# Patient Record
Sex: Female | Born: 1980 | Smoking: Former smoker
Health system: Southern US, Community
[De-identification: ages and names within clinical notes are randomized; demographics above are authoritative.]

## PROBLEM LIST (undated history)

## (undated) DIAGNOSIS — R519 Headache, unspecified: Secondary | ICD-10-CM

## (undated) DIAGNOSIS — N200 Calculus of kidney: Secondary | ICD-10-CM

## (undated) DIAGNOSIS — R569 Unspecified convulsions: Secondary | ICD-10-CM

## (undated) DIAGNOSIS — F419 Anxiety disorder, unspecified: Secondary | ICD-10-CM

## (undated) DIAGNOSIS — R51 Headache: Secondary | ICD-10-CM

## (undated) DIAGNOSIS — J45909 Unspecified asthma, uncomplicated: Secondary | ICD-10-CM

## (undated) DIAGNOSIS — M549 Dorsalgia, unspecified: Secondary | ICD-10-CM

## (undated) HISTORY — DX: Headache, unspecified: R51.9

## (undated) HISTORY — PX: WISDOM TOOTH EXTRACTION: SHX21

## (undated) HISTORY — DX: Unspecified asthma, uncomplicated: J45.909

## (undated) HISTORY — DX: Dorsalgia, unspecified: M54.9

## (undated) HISTORY — DX: Anxiety disorder, unspecified: F41.9

## (undated) HISTORY — DX: Calculus of kidney: N20.0

## (undated) HISTORY — DX: Headache: R51

## (undated) HISTORY — DX: Unspecified convulsions: R56.9

## (undated) HISTORY — DX: Morbid (severe) obesity due to excess calories: E66.01

---

## 2016-05-17 ENCOUNTER — Other Ambulatory Visit (HOSPITAL_COMMUNITY): Payer: Self-pay | Admitting: General Surgery

## 2016-06-05 ENCOUNTER — Encounter: Payer: BLUE CROSS/BLUE SHIELD | Attending: Family Medicine | Admitting: Registered"

## 2016-06-05 ENCOUNTER — Encounter: Payer: Self-pay | Admitting: Registered"

## 2016-06-05 DIAGNOSIS — Z87891 Personal history of nicotine dependence: Secondary | ICD-10-CM | POA: Insufficient documentation

## 2016-06-05 DIAGNOSIS — Z713 Dietary counseling and surveillance: Secondary | ICD-10-CM | POA: Diagnosis present

## 2016-06-05 DIAGNOSIS — E669 Obesity, unspecified: Secondary | ICD-10-CM

## 2016-06-05 NOTE — Progress Notes (Addendum)
Pre-Op Assessment Visit:  Pre-Operative Sleeve Gastrectomy Surgery  Medical Nutrition Therapy:  Appt start time: 2:04  End time:  3:05  Patient was seen on 06/05/2016 for Pre-Operative Nutrition Assessment. Assessment and [facility] on 06/05/2016.   Pt expectation of surgery: reduce health risks  Pt expectation of Dietitian: Something that fits mutiple eating habits  Start weight at N[facility]: 268.0 BMI: 47.47   Pt arrives with husband. Pt states she quit smoking and  changed jobs which led to weight gain. Pt states she likes to bake and sweets are her downfall. Pt states she and her husband are going to make the necessary changes together. Pt states she is really busy at work and doesn't have much time to eat and/or drink.   Pt needs 6 SWL visits prior to surgery, pre referral form.    24 hr Dietary Recall: First Meal: skips Snack: peanut butter crackers Second Meal: skips; cracker Snack: none Third Meal: pizza rolls, frozen pizza, hot dogs, apple pie with ice cream, 2 layer cake  Snack: apple pie with ice cream Beverages: Gretel light with water, coffee, skim/2% milk, V8 juice, fruit juices  Encouraged to engage in 150 minutes of moderate physical activity including cardiovascular and weight baring weekly  Handouts given during visit include:  . Pre-Op Goals . Bariatric Surgery Protein Shakes . Vitamin and Mineral Recommendations  During the appointment today the following Pre-Op Goals were reviewed with the patient: . Maintain or lose weight as instructed by your surgeon . Make healthy food choices . Begin to limit portion sizes . Limited concentrated sugars and fried foods . Keep fat/sugar in the single digits per serving on          food labels . Practice CHEWING your food  (aim for 30 chews per bite or until applesauce consistency) . Practice not drinking 15 minutes before, during, and 30  minutes after each meal/snack . Avoid all carbonated beverages  . Avoid/limit caffeinated beverages  . Avoid all sugar-sweetened beverages . Consume 3 meals per day; eat every 3-5 hours . Make a list of non-food related activities . Aim for 64-100 ounces of FLUID daily  . Aim for at least 60-80 grams of PROTEIN daily . Look for a liquid protein source that contain ?15 g protein and ?5 g carbohydrate  (ex: shakes, drinks, shots) . Physical activity is an important part of a healthy lifestyle so keep it moving!  Follow diet recommendations listed below Energy and Macronutrient Recommendations: Calories: 1800 Carbohydrate: 200 Protein: 135 Fat: 50  Demonstrated degree of understanding via:  Teach Back   Teaching Method Utilized:  Visual Auditory Hands on  Barriers to learning/adherence to lifestyle change: work-life balance  Patient to call the Nutrition and Diabetes Education Services to enroll in Pre-Op and Post-Op Nutrition Education when surgery date is scheduled.

## 2016-06-11 ENCOUNTER — Other Ambulatory Visit: Payer: Self-pay

## 2016-06-11 ENCOUNTER — Ambulatory Visit (HOSPITAL_COMMUNITY)
Admission: RE | Admit: 2016-06-11 | Discharge: 2016-06-11 | Disposition: A | Discharge status: Home / Self Care | Payer: BLUE CROSS/BLUE SHIELD | Source: Ambulatory Visit | Attending: General Surgery | Admitting: General Surgery

## 2016-06-11 DIAGNOSIS — Z888 Allergy status to other drugs, medicaments and biological substances status: Secondary | ICD-10-CM | POA: Diagnosis not present

## 2016-06-11 DIAGNOSIS — Z9889 Other specified postprocedural states: Secondary | ICD-10-CM | POA: Diagnosis not present

## 2016-06-11 DIAGNOSIS — Z8249 Family history of ischemic heart disease and other diseases of the circulatory system: Secondary | ICD-10-CM | POA: Diagnosis not present

## 2016-06-11 DIAGNOSIS — K449 Diaphragmatic hernia without obstruction or gangrene: Secondary | ICD-10-CM | POA: Insufficient documentation

## 2016-06-11 DIAGNOSIS — Z811 Family history of alcohol abuse and dependence: Secondary | ICD-10-CM | POA: Diagnosis not present

## 2016-06-11 DIAGNOSIS — J45909 Unspecified asthma, uncomplicated: Secondary | ICD-10-CM | POA: Diagnosis not present

## 2016-06-11 DIAGNOSIS — Z8261 Family history of arthritis: Secondary | ICD-10-CM | POA: Insufficient documentation

## 2016-06-11 DIAGNOSIS — Z818 Family history of other mental and behavioral disorders: Secondary | ICD-10-CM | POA: Diagnosis not present

## 2016-06-11 DIAGNOSIS — K30 Functional dyspepsia: Secondary | ICD-10-CM | POA: Diagnosis not present

## 2016-06-11 DIAGNOSIS — Z825 Family history of asthma and other chronic lower respiratory diseases: Secondary | ICD-10-CM | POA: Diagnosis not present

## 2016-06-11 DIAGNOSIS — Z01810 Encounter for preprocedural cardiovascular examination: Secondary | ICD-10-CM | POA: Diagnosis present

## 2016-06-11 DIAGNOSIS — F419 Anxiety disorder, unspecified: Secondary | ICD-10-CM | POA: Diagnosis not present

## 2016-06-27 ENCOUNTER — Ambulatory Visit (INDEPENDENT_AMBULATORY_CARE_PROVIDER_SITE_OTHER): Payer: BLUE CROSS/BLUE SHIELD | Admitting: Neurology

## 2016-06-27 ENCOUNTER — Encounter: Payer: Self-pay | Admitting: Neurology

## 2016-06-27 VITALS — BP 144/84 | HR 57 | Ht 63.0 in | Wt 266.0 lb

## 2016-06-27 DIAGNOSIS — Z6841 Body Mass Index (BMI) 40.0 and over, adult: Secondary | ICD-10-CM

## 2016-06-27 DIAGNOSIS — R351 Nocturia: Secondary | ICD-10-CM | POA: Diagnosis not present

## 2016-06-27 DIAGNOSIS — R0683 Snoring: Secondary | ICD-10-CM | POA: Diagnosis not present

## 2016-06-27 DIAGNOSIS — Z820 Family history of epilepsy and other diseases of the nervous system: Secondary | ICD-10-CM

## 2016-06-27 DIAGNOSIS — R51 Headache: Secondary | ICD-10-CM | POA: Diagnosis not present

## 2016-06-27 DIAGNOSIS — R519 Headache, unspecified: Secondary | ICD-10-CM

## 2016-06-27 DIAGNOSIS — G2581 Restless legs syndrome: Secondary | ICD-10-CM | POA: Diagnosis not present

## 2016-06-27 NOTE — Progress Notes (Signed)
Subjective:    Patient ID: Donna Mcintyre is a 36 y.o. female.  HPI     Donna FoleySaima Elman Dettman, MD, PhD [facility] [address], [address] [address] [place], [place] [id number]   Dear Dr. Andrey CampanileWilson,  I saw your patient, Donna Mcintyre, upon your kind request in my neurologic clinic today for initial consultation of her sleep disorder, in particular, concern for underlying obstructive sleep apnea. The patient is unaccompanied today. As you know, Ms. Donna Mcintyre is a 36 year old right-handed woman with an underlying medical history of kidney stone, asthma, anxiety, depression, back pain and morbid obesity with a BMI of over 40, who had a home sleep test in April 2018, which showed an AHI of 3 per hour, and further evaluation was recommended based on clinical history and high pretest probability of having OSA. I reviewed her home sleep test report from 04/17/2016 (report date 04/25/16). I also reviewed your office note from 05/16/2016. She is being considered for sleeve gastrectomy. She quit smoking in November 2017. She smoked for about 25 years. She has rare alcohol use, a remote history of kidney stones. Her Epworth Sleepiness Scale score is 5 out of 24, fatigue score is 37 out of 63. She lives at home with her husband and 3 children. She works at [employer]. She drinks alcohol only twice a year or so, she drinks caffeine, in the form of coffee, usually one cup per day.  She is not a good sleeper, she admits and as long a she can remember, she has had difficulty falling asleep and staying asleep for years.  Her father has OSA. Her bedtime is typically around 10 AM but she typically may not be asleep until 1 AM. Wake up time is around 6:30 AM. She has occasional restless leg symptoms but is not sure if she moves her legs in her sleep. She has had occasional morning headaches, she has nocturia usually once per average night. She has 3 kids, ages 5018, 4316 and 468. Her husband also snores but  she does not seem to be disturbed by it. She has 1 older brother who does not have any known history of sleep apnea. She works from 7 AM to 7 PM. She works 5 days a week. She has an appointment pending with psychology next month.   Her Past Medical History Is Significant For: Past Medical History:  Diagnosis Date  . Anxiety   . Asthma   . Back pain   . Headache   . Kidney stone   . Morbid obesity (HCC)   . Seizures (HCC)     Her Past Surgical History Is Significant For: Past Surgical History:  Procedure Laterality Date  . CESAREAN SECTION    . WISDOM TOOTH EXTRACTION      Her Family History Is Significant For: Family History  Problem Relation Age of Onset  . Asthma Other   . Cancer Other   . Hypertension Other   . Heart disease Other   . Diabetes Other   . Arthritis Mother   . Alcohol abuse Father   . Arthritis Father   . Hypertension Father   . Heart failure Father   . Alcohol abuse Brother   . Heart disease Paternal Grandmother   . Cancer Paternal Grandfather     Her Social History Is Significant For: Social History   Social History  . Marital status: Unknown    Spouse name: N/A  . Number of children: N/A  . Years of  education: N/A   Social History Main Topics  . Smoking status: Former Games developer  . Smokeless tobacco: Never Used  . Alcohol use None  . Drug use: No  . Sexual activity: Not Asked   Other Topics Concern  . None   Social History Narrative  . None    Her Allergies Are:  Allergies  Allergen Reactions  . Levaquin [Levofloxacin In D5w] Other (See Comments)    Throat swells  :   Her Current Medications Are:  No outpatient encounter prescriptions on file as of 06/27/2016.   No facility-administered encounter medications on file as of 06/27/2016.   :  Review of Systems:  Out of a complete 14 point review of systems, all are reviewed and negative with the exception of these symptoms as listed below: Review of Systems  Neurological:        Pt presents today to discuss abnormal HST results. She had an HST in April. Pt does endorse snoring.  Epworth Sleepiness Scale 0= would never doze 1= slight chance of dozing 2= moderate chance of dozing 3= high chance of dozing  Sitting and reading: 0 Watching TV: 0 Sitting inactive in a public place (ex. Theater or meeting): 1 As a passenger in a car for an hour without a break: 0 Lying down to rest in the afternoon: 2 Sitting and talking to someone: 1 Sitting quietly after lunch (no alcohol): 1 In a car, while stopped in traffic: 0 Total: 5     Objective:  Neurologic Exam  Physical Exam Physical Examination:   Vitals:   06/27/16 1054  BP: (!) 144/84  Pulse: (!) 57   General Examination: The patient is a very pleasant 36 y.o. female in no acute distress. She appears well-developed and well-nourished and well groomed.   HEENT: Normocephalic, atraumatic, pupils are equal, round and reactive to light and accommodation. Extraocular tracking is good without limitation to gaze excursion or nystagmus noted. Normal smooth pursuit is noted. Hearing is grossly intact. Face is symmetric with normal facial animation and normal facial sensation. Speech is clear with no dysarthria noted. There is no hypophonia. There is no lip, neck/head, jaw or voice tremor. Neck is supple with full range of passive and active motion. There are no carotid bruits on auscultation. Oropharynx exam reveals: mild mouth dryness, adequate dental hygiene and moderate airway crowding, due to tonsillar size of 2+ to 3+ bilaterally, smaller airway entry, thicker appearing uvula, Mallampati is class I. Tongue protrudes centrally and palate elevates symmetrically. 1+/2+ in size/absent. Neck size is 17 1/8 inches. She has a Mild overbite.  Chest: Clear to auscultation without wheezing, rhonchi or crackles noted.  Heart: S1+S2+0, regular and normal without murmurs, rubs or gallops noted.   Abdomen: Soft, non-tender and  non-distended with normal bowel sounds appreciated on auscultation.  Extremities: There is no pitting edema in the distal lower extremities bilaterally. Pedal pulses are intact.  Skin: Warm and dry without trophic changes noted. There are no varicose veins.  Musculoskeletal: exam reveals no obvious joint deformities, tenderness or joint swelling or erythema.   Neurologically:  Mental status: The patient is awake, alert and oriented in all 4 spheres. Her immediate and remote memory, attention, language skills and fund of knowledge are appropriate. There is no evidence of aphasia, agnosia, apraxia or anomia. Speech is clear with normal prosody and enunciation. Thought process is linear. Mood is normal and affect is normal.  Cranial nerves II - XII are as described above under HEENT exam.  In addition: shoulder shrug is normal with equal shoulder height noted. Motor exam: Normal bulk, strength and tone is noted. There is no drift, tremor or rebound. Romberg is negative. Reflexes are 2+ throughout. Babinski: Toes are flexor bilaterally. Fine motor skills and coordination: intact with normal finger taps, normal hand movements, normal rapid alternating patting, normal foot taps and normal foot agility.  Cerebellar testing: No dysmetria or intention tremor on finger to nose testing. Heel to shin is unremarkable bilaterally. There is no truncal or gait ataxia.  Sensory exam: intact to light touch in the upper and lower extremities.  Gait, station and balance: She stands easily. No veering to one side is noted. No leaning to one side is noted. Posture is age-appropriate and stance is narrow based. Gait shows normal stride length and normal pace. No problems turning are noted. Tandem walk is unremarkable.   Assessment and Plan:  In summary, Mazzy Marzano is a very pleasant 36 y.o.-year old female with a history and physical exam concerning for obstructive sleep apnea (OSA). I had a long chat with the  patient about my findings and the diagnosis of OSA, its prognosis and treatment options. We talked about medical treatments, surgical interventions and non-pharmacological approaches. I explained in particular the risks and ramifications of untreated moderate to severe OSA, especially with respect to developing cardiovascular disease down the Road, including congestive heart failure, difficult to treat hypertension, cardiac arrhythmias, or stroke. Even type 2 diabetes has, in part, been linked to untreated OSA. Symptoms of untreated OSA include daytime sleepiness, memory problems, mood irritability and mood disorder such as depression and anxiety, lack of energy, as well as recurrent headaches, especially morning headaches. We talked about trying to maintain a healthy lifestyle in general, as well as the importance of weight control. I encouraged the patient to eat healthy, exercise daily and keep well hydrated, to keep a scheduled bedtime and wake time routine, to not skip any meals and eat healthy snacks in between meals. I advised the patient not to drive when feeling sleepy. I recommended the following at this time: sleep study with potential positive airway pressure titration. (We will score hypopneas at 3%). Of note, the patient had a negative home sleep test in April 2018, therefore, and attended sleep study is indicated in the context of high clinical suspicion for OSA (snoring, morning headaches, difficulty with sleep initiation and sleep maintenance, morbid obesity), especially as the patient is planning elective surgery.  I explained the sleep test procedure to the patient and also outlined possible surgical and non-surgical treatment options of OSA, including the use of a custom-made dental device (which would require a referral to a specialist dentist or oral surgeon), upper airway surgical options, such as pillar implants, radiofrequency surgery, tongue base surgery, and UPPP (which would involve a  referral to an ENT surgeon). Rarely, jaw surgery such as mandibular advancement may be considered.  I also explained the CPAP treatment option to the patient, who indicated that she would be willing to try CPAP if the need arises. I explained the importance of being compliant with PAP treatment, not only for insurance purposes but primarily to improve Her symptoms, and for the patient's long term health benefit, including to reduce Her cardiovascular risks. I answered all her questions today and the patient was in agreement. I would like to see her back after the sleep study is completed and encouraged her to call with any interim questions, concerns, problems or updates.   Thank you  very much for allowing me to participate in the care of this nice patient. If I can be of any further assistance to you please do not hesitate to call me at [phone].  Sincerely,   Star Age, MD, PhD

## 2016-06-27 NOTE — Patient Instructions (Signed)

## 2016-07-08 ENCOUNTER — Encounter: Payer: Self-pay | Admitting: Registered"

## 2016-07-08 ENCOUNTER — Encounter: Payer: BLUE CROSS/BLUE SHIELD | Attending: Family Medicine | Admitting: Registered"

## 2016-07-08 DIAGNOSIS — Z713 Dietary counseling and surveillance: Secondary | ICD-10-CM | POA: Insufficient documentation

## 2016-07-08 DIAGNOSIS — E669 Obesity, unspecified: Secondary | ICD-10-CM

## 2016-07-08 DIAGNOSIS — Z87891 Personal history of nicotine dependence: Secondary | ICD-10-CM | POA: Insufficient documentation

## 2016-07-08 NOTE — Patient Instructions (Addendum)
-   Aim to get 64 oz of fluid a day.   - Take a multivitamin complete (with iron) and Vitamin D supplement.   - Keep up the great work with what you're doing!

## 2016-07-08 NOTE — Progress Notes (Signed)
Appt start time: 10:20 end time: 10:35  Assessment: 1st SWL Appointment.   Start Wt at NDES: 268.0 Wt: 268.1 BMI: 47.49    Pt arrives having maintained weight from previous visit. Pt states she is working on chewing. Pt reports it is easier to swallow meat and not potatoes. Pt states its a challenge not drinking with meals but still working on it. Pt reports working on getting 3 meals most days of the week and has increased water intake. Pt states she is eating smaller portions and has eliminated desserts.    MEDICATIONS: See list   DIETARY INTAKE:  24-hr recall:  B ( AM): cereal (Shredded wheat)  Snk ( AM): none  L ( PM): sometime skips; Keye to Healthy (baked chicken breast, salsa, black beans, pureed cauliflower) Snk ( PM): none D ( PM): grilled burgers or baked chicken, beans, mac and cheese, potatoes Snk ( PM): none Beverages: water, sugar-free popsicles, Misa light, 2% milk    Usual physical activity: mowing grass  Diet to Follow: Calories: 1800 kcals Carbohydrate: 200g Protein: 135g Fat: 50g  Preferred Learning Style:   No preference indicated   Learning Readiness:   Ready  Change in progress     Nutritional Diagnosis:  [place]-3.3 Overweight/obesity related to past poor dietary habits and physical inactivity as evidenced by patient w/ planned sleeve gastrectomy surgery following dietary guidelines for continued weight loss.    Intervention:  Nutrition counseling for upcoming Bariatric Surgery.  Goals:  - Aim to get 64 oz of fluid a day.  - Take a multivitamin complete (with iron) and Vitamin D supplement.  - Keep up the great work with what you're doing!  Teaching Method Utilized:  Visual Auditory  Handouts given during visit include:  none  Barriers to learning/adherence to lifestyle change: none  Demonstrated degree of understanding via:  Teach Back   Monitoring/Evaluation:  Dietary intake, exercise, and body weight in 1 month(s).

## 2016-07-16 ENCOUNTER — Ambulatory Visit: Payer: BLUE CROSS/BLUE SHIELD | Admitting: Psychiatry

## 2016-07-22 ENCOUNTER — Ambulatory Visit (INDEPENDENT_AMBULATORY_CARE_PROVIDER_SITE_OTHER): Payer: BLUE CROSS/BLUE SHIELD | Admitting: Neurology

## 2016-07-22 DIAGNOSIS — G4733 Obstructive sleep apnea (adult) (pediatric): Secondary | ICD-10-CM | POA: Diagnosis not present

## 2016-07-26 NOTE — Procedures (Signed)
PATIENT'S NAME:  Donna Mcintyre, Donna Mcintyre DOB:      [birth year]      MR#:    [id number]     DATE OF RECORDING: 07/22/2016 REFERRING M.D.:  Gaynelle Adu, MD Study Performed:   Baseline Polysomnogram HISTORY: 36 year old woman with a history of kidney stone, asthma, anxiety, depression, back pain and morbid obesity, who had a home sleep test in April 2018, which showed an AHI of 3 per hour, and further evaluation was recommended based on clinical history and high pretest probability of having OSA. The patient's weight 266 pounds with a height of 63 (inches), resulting in a BMI of 47.3 kg/m2. The patient's neck circumference measured 17.1 inches.   CURRENT MEDICATIONS: Levaquin   PROCEDURE:  This is a multichannel digital polysomnogram utilizing the Somnostar 11.2 system.  Electrodes and sensors were applied and monitored per AASM Specifications.   EEG, EOG, Chin and Limb EMG, were sampled at 200 Hz.  ECG, Snore and Nasal Pressure, Thermal Airflow, Respiratory Effort, CPAP Flow and Pressure, Oximetry was sampled at 50 Hz. Digital video and audio were recorded.      BASELINE STUDY  Lights Out was at 23:05 and Lights On at 05:07.  Total recording time (TRT) was 362.5 minutes, with a total sleep time (TST) of 261 minutes.   The patient's sleep latency was 29.5 minutes, which is delayed.  REM latency was 75 minutes, which is normal. The sleep efficiency was 72. %.     SLEEP ARCHITECTURE: WASO (Wake after sleep onset) was increased and patient did not go back to sleep after 03:53 AM, after her restroom break. There were 9.5 minutes in Stage N1, 144.5 minutes Stage N2, 65 minutes Stage N3 and 42 minutes in Stage REM.  The percentage of Stage N1 was 3.6%, Stage N2 was 55.4%, which is normal, Stage N3 was 24.9%, and Stage R (REM sleep) was 16.1%, which is mildly reduced.  The arousals were noted as: 23 were spontaneous, 0 were associated with PLMs, 42 were associated with respiratory events.    Audio and video analysis  did not show any abnormal or unusual movements, behaviors, phonations or vocalizations.  The patient took 1 bathroom break. Snoring was noted, mostly in the moderate range. The EKG was in keeping with normal sinus rhythm (NSR).  RESPIRATORY ANALYSIS:  There were a total of 42 respiratory events:  20 obstructive apneas, 0 central apneas and 0 mixed apneas with a total of 20 apneas and an apnea index (AI) of 4.6 /hour. There were 22 hypopneas with a hypopnea index of 5.1 /hour. The patient also had 0 respiratory event related arousals (RERAs).      The total APNEA/HYPOPNEA INDEX (AHI) was 9.7/hour and the total RESPIRATORY DISTURBANCE INDEX was 9.7 /hour.  35 events occurred in REM sleep and 14 events in NREM. The REM AHI was 50 /hour, versus a non-REM AHI of 1.9. The patient spent 261 minutes of total sleep time in the supine position and 0 minutes in non-supine.. The supine AHI was 9.7 versus a non-supine AHI of 0.0.  OXYGEN SATURATION & C02:  The Wake baseline 02 saturation was 97%, with the lowest being 81%. Time spent below 89% saturation equaled 4 minutes.  PERIODIC LIMB MOVEMENTS: The patient had a total of 0 Periodic Limb Movements. The Periodic Limb Movement (PLM) index was 0 and the PLM Arousal index was 0/hour.  Post-study, the patient indicated that sleep was the same as usual.   IMPRESSION:  1. Obstructive Sleep Apnea (  OSA)  RECOMMENDATIONS:  1. This study demonstrates overall mild obstructive sleep apnea, severe in REM sleep, with a total AHI of 9.7/hour, REM AHI of 50/hour, and O2 nadir of 81% (during supine REM sleep). Given the patient's medical history and weight loss surgery planned, a full-night CPAP titration study is recommended to optimize therapy. Other treatment options may include avoidance of supine sleep position along with weight loss, upper airway or jaw surgery in selected patients or the use of an oral appliance in certain patients. ENT evaluation and/or consultation  with a maxillofacial surgeon or dentist may be feasible in some instances.    2. Please note that untreated obstructive sleep apnea carries additional perioperative morbidity. Patients with significant obstructive sleep apnea should receive perioperative PAP therapy and the surgeons and particularly the anesthesiologist should be informed of the diagnosis and the severity of the sleep disordered breathing. 3. The patient should be cautioned not to drive, work at heights, or operate dangerous or heavy equipment when tired or sleepy. Review and reiteration of good sleep hygiene measures should be pursued with any patient. 4. The patient will be [facility] discussion of the test results and further management strategies. The referring provider will be notified of the test results.  I certify that I have reviewed the entire raw data recording prior to the issuance of this report in accordance with the Standards of Accreditation of the American Academy of Sleep Medicine (AASM)   Huston FoleySaima Jovanny Stephanie, MD, PhD Diplomat, American Board of Psychiatry and Neurology (Neurology and Sleep Medicine)

## 2016-07-26 NOTE — Progress Notes (Signed)
Patient referred by Dr. Andrey CampanileWilson in surgery, seen by me on 06/27/16, after a neg HST, diagnostic PSG on 07/22/16 d/t high risk for OSA.    Please call and notify the patient that the recent sleep study did confirm the diagnosis obstructive sleep apnea and that I recommend treatment for this in the form of CPAP. She has overall mild obstructive sleep apnea, severe in REM sleep, with a total AHI of 9.7/hour, REM AHI of 50/hour, and O2 nadir of 81% (during supine REM sleep). Given the patient's medical history and weight loss surgery planned, a full-night CPAP titration study is recommended to optimize therapy. This will require a repeat sleep study for proper titration and mask fitting. Please explain to patient and arrange for a CPAP titration study. I have placed an order in the chart. Thanks, and [facility].  Huston FoleySaima Bruin Bolger, MD, PhD [facility]

## 2016-07-26 NOTE — Addendum Note (Signed)
Addended by: Huston FoleyATHAR, Adonai Helzer on: 07/26/2016 12:46 PM   Modules accepted: Orders

## 2016-07-29 ENCOUNTER — Telehealth: Payer: Self-pay

## 2016-07-29 NOTE — Telephone Encounter (Signed)
I called pt to discuss her sleep study results. No answer, left a message asking her to call me back. 

## 2016-07-29 NOTE — Telephone Encounter (Signed)
-----   Message from Huston FoleySaima Athar, MD sent at 07/26/2016 12:46 PM EDT ----- Patient referred by Dr. Andrey CampanileWilson in surgery, seen by me on 06/27/16, after a neg HST, diagnostic PSG on 07/22/16 d/t high risk for OSA.    Please call and notify the patient that the recent sleep study did confirm the diagnosis obstructive sleep apnea and that I recommend treatment for this in the form of CPAP. She has overall mild obstructive sleep apnea, severe in REM sleep, with a total AHI of 9.7/hour, REM AHI of 50/hour, and O2 nadir of 81% (during supine REM sleep). Given the patient's medical history and weight loss surgery planned, a full-night CPAP titration study is recommended to optimize therapy. This will require a repeat sleep study for proper titration and mask fitting. Please explain to patient and arrange for a CPAP titration study. I have placed an order in the chart. Thanks, and [facility].  Huston FoleySaima Athar, MD, PhD [facility])

## 2016-07-29 NOTE — Telephone Encounter (Signed)
I called pt. I advised pt that Dr. Frances FurbishAthar reviewed their sleep study results and found that pt has osa, mild overall, but severe osa in her REM sleep and Dr. Frances FurbishAthar recommends a cpap for treatment for osa. Dr. Frances FurbishAthar recommends that pt return for a repeat sleep study in order to properly titrate the cpap and ensure a good mask fit. Pt is agreeable to returning for a titration study. I advised pt that our sleep lab will file with pt's insurance and call pt to schedule the sleep study when we hear back from the pt's insurance regarding coverage of this sleep study. Pt verbalized understanding of results. Pt had no questions at this time but was encouraged to call back if questions arise.

## 2016-08-07 ENCOUNTER — Encounter: Payer: BLUE CROSS/BLUE SHIELD | Attending: Family Medicine | Admitting: Registered"

## 2016-08-07 ENCOUNTER — Encounter: Payer: Self-pay | Admitting: Registered"

## 2016-08-07 DIAGNOSIS — Z87891 Personal history of nicotine dependence: Secondary | ICD-10-CM | POA: Insufficient documentation

## 2016-08-07 DIAGNOSIS — Z713 Dietary counseling and surveillance: Secondary | ICD-10-CM | POA: Insufficient documentation

## 2016-08-07 DIAGNOSIS — E669 Obesity, unspecified: Secondary | ICD-10-CM
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
## 2016-08-07 NOTE — Patient Instructions (Addendum)
-   Increase physical activity to walking 2 blocks around neighborhood for 20 min/day, 7 days/week.  - Continue to aim to slow down and take smaller bites the size of a dime or thumbnail.   - Continue to aim for 64 oz of fluid daily.

## 2016-08-12 ENCOUNTER — Ambulatory Visit (INDEPENDENT_AMBULATORY_CARE_PROVIDER_SITE_OTHER): Payer: BLUE CROSS/BLUE SHIELD | Admitting: Neurology

## 2016-08-12 DIAGNOSIS — G4733 Obstructive sleep apnea (adult) (pediatric): Secondary | ICD-10-CM | POA: Diagnosis not present

## 2016-08-14 ENCOUNTER — Telehealth: Payer: Self-pay

## 2016-08-14 NOTE — Procedures (Signed)
PATIENT'S NAME:  Donna Mcintyre, Donna Mcintyre DOB:      03/22/1980      MR#:    [id number]     DATE OF RECORDING: 08/12/2016 REFERRING M.D.:  Gaynelle AduEric Wilson, MD Study Performed:   CPAP  Titration HISTORY: 36 year old woman with a history of kidney stone, asthma, anxiety, depression, back pain and morbid obesity, who had a home sleep test in April 2018, which showed an AHI of 3 per hour, and further evaluation was recommended based on clinical history and high pretest probability of having OSA. Her lab attended PSG on 07/22/16 showed a total AHI of 9.7/hour, REM AHI of 50/hour, and O2 nadir of 81% (during supine REM sleep). She returns for a CPAP titration. BMI of 47.3 kg/m2, neck circumference measured 17 inches.  CURRENT MEDICATIONS: None  PROCEDURE:  This is a multichannel digital polysomnogram utilizing the SomnoStar 11.2 system.  Electrodes and sensors were applied and monitored per AASM Specifications.   EEG, EOG, Chin and Limb EMG, were sampled at 200 Hz.  ECG, Snore and Nasal Pressure, Thermal Airflow, Respiratory Effort, CPAP Flow and Pressure, Oximetry was sampled at 50 Hz. Digital video and audio were recorded.      The patient was fitted with a small P10 nasal pillows interface. CPAP was initiated at 5 cmH20 with heated humidity per AASM standards and pressure was advanced to 9 cmH20 because of hypopneas, apneas and desaturations.  At a PAP pressure of 8 cmH20, there was a reduction of the AHI to 0 with supine REM sleep achieved and O2 nadir of 96%.    Lights Out was at 21:11 and Lights On at 05:01. Total recording time (TRT) was 470 minutes, with a total sleep time (TST) of 350 minutes. The patient's sleep latency was 129 minutes, which is markedly delayed, REM latency was 93 minutes, which is normal. The sleep efficiency was 74.5 %.    SLEEP ARCHITECTURE: WASO (Wake after sleep onset) was 15.5 minutes without significant sleep fragmentation noted. There were 16 minutes in Stage N1, 165.5 minutes Stage  N2, 78.5 minutes Stage N3 and 90 minutes in Stage REM.  The percentage of Stage N1 was 4.6%, Stage N2 was 47.3%, which is normal, Stage N3 was 22.4%, which is normal, and Stage R (REM sleep) was 25.7%, which is normal. The arousals were noted as: 16 were spontaneous, 0 were associated with PLMs, 1 were associated with respiratory events.  Audio and video analysis did not show any abnormal or unusual movements, behaviors, phonations or vocalizations. The patient took no bathroom breaks. The EKG was in keeping with normal sinus rhythm (NSR).  RESPIRATORY ANALYSIS:  There was a total of 1 respiratory events: 0 obstructive apneas, 0 central apneas and 0 mixed apneas with a total of 0 apneas and an apnea index (AI) of 0 /hour. There were 1 hypopneas with a hypopnea index of .2/hour. The patient also had 0 respiratory event related arousals (RERAs).      The total APNEA/HYPOPNEA INDEX  (AHI) was .2 /hour and the total RESPIRATORY DISTURBANCE INDEX was .2 .hour  0 events occurred in REM sleep and 1 events in NREM. The REM AHI was 0 /hour versus a non-REM AHI of .2 /hour.  The patient spent 337.5 minutes of total sleep time in the supine position and 13 minutes in non-supine. The supine AHI was 0.2, versus a non-supine AHI of 0.0.  OXYGEN SATURATION & C02:  The baseline 02 saturation was 98%, with the lowest being 90%. Time spent  below 89% saturation equaled 0 minutes.  PERIODIC LIMB MOVEMENTS:  The patient had a total of 0 Periodic Limb Movements. The Periodic Limb Movement (PLM) index was 0 and the PLM Arousal index was 0 /hour.  Post-study, the patient indicated that sleep was better than usual.   DIAGNOSIS  1. Obstructive Sleep Apnea (OSA)   PLANS/RECOMMENDATIONS:  1. This study demonstrates resolution of the patient's obstructive sleep apnea with CPAP therapy. I will, therefore, start the patient on home CPAP treatment at a pressure of 8 cm via small nasal pillows with heated humidity. The patient  should be reminded to be fully compliant with PAP therapy to improve sleep related symptoms and decrease long term cardiovascular risks. The patient should be reminded, that it may take up to 3 months to get fully used to using PAP with all planned sleep. The earlier full compliance is achieved, the better long term compliance tends to be. Please note that untreated obstructive sleep apnea carries additional perioperative morbidity. Patients with significant obstructive sleep apnea should receive perioperative PAP therapy and the surgeons and particularly the anesthesiologist should be informed of the diagnosis and the severity of the sleep disordered breathing. 2. The patient should be cautioned not to drive, work at heights, or operate dangerous or heavy equipment when tired or sleepy. Review and reiteration of good sleep hygiene measures should be pursued with any patient. 3. The patient will be [facility] discussion of the test results and further management strategies. The referring provider will be notified of the test results.   I certify that I have reviewed the entire raw data recording prior to the issuance of this report in accordance with the Standards of Accreditation of the American Academy of Sleep Medicine (AASM)   Huston FoleySaima Jatziry Wechter, MD, PhD Diplomat, American Board of Psychiatry and Neurology (Neurology and Sleep Medicine)

## 2016-08-14 NOTE — Telephone Encounter (Signed)
I called pt. I advised pt that Dr. Frances FurbishAthar reviewed their sleep study results and found that pt did well with the cpap during her latest sleep study. Dr. Frances FurbishAthar recommends that pt start a cpap at home. I reviewed PAP compliance expectations with the pt. Pt is agreeable to starting a CPAP. I advised pt that an order will be sent to a DME, [facility], and [facility] will call the pt within about one week after they file with the pt's insurance. [facility] will show the pt how to use the machine, fit for masks, and troubleshoot the CPAP if needed. A follow up appt was made for insurance purposes with Dr. Frances FurbishAthar on 10/17/2016 at 10:30am. Pt verbalized understanding to arrive 15 minutes early and bring their CPAP. A letter with all of this information in it will be mailed to the pt as a reminder. I verified with the pt that the address we have on file is correct. Pt verbalized understanding of results. Pt had no questions at this time but was encouraged to call back if questions arise.

## 2016-08-14 NOTE — Telephone Encounter (Signed)
-----   Message from Huston FoleySaima Athar, MD sent at 08/14/2016  8:26 AM EDT ----- Patient referred by Dr. Andrey CampanileWilson in surgery, seen by me on 06/27/16, after a neg HST, diagnostic PSG on 07/22/16 d/t high risk for OSA, CPAP study on 08/12/16.     Please call and inform patient that I have entered an order for treatment with positive airway pressure (PAP) treatment of obstructive sleep apnea (OSA). She did well during the latest sleep study with CPAP. We will, therefore, arrange for a machine for home use through a DME (durable medical equipment) company of Her choice; and I will see the patient back in follow-up in about 10 weeks, may see Donna Mcintyre or Donna Mcintyre, NPs. Please also explain to the patient that I will be looking out for compliance data, which can be downloaded from the machine (stored on an SD card, that is inserted in the machine) or via remote access through a modem, that is built into the machine. At the time of the followup appointment we will discuss sleep study results and how it is going with PAP treatment at home. Please advise patient to bring Her machine at the time of the first FU visit, even though this is cumbersome. Bringing the machine for every visit after that will likely not be needed, but often helps for the first visit to troubleshoot if needed. Please re-enforce the importance of compliance with treatment and the need for us to monitor compliance data - often an insurance requirement and actually good feedback for the patient as far as how they are doing.  Also remind patient, that any interim PAP machine or mask issues should be first addressed with the DME company, as they can often help better with technical and mask fit issues. Please ask if patient has a preference regarding DME company.  Please also make sure, the patient has a follow-up appointment with me or one of our NPs in about 10 weeks from the setup date, thanks.  Once you have spoken to the patient - and faxed/routed report to PCP and  referring MD (if other than PCP), you can close this encounter, thanks,   Huston FoleySaima Athar, MD, PhD [facility]

## 2016-08-14 NOTE — Progress Notes (Signed)
Patient referred by Dr. Andrey CampanileWilson in surgery, seen by me on 06/27/16, after a neg HST, diagnostic PSG on 07/22/16 d/t high risk for OSA, CPAP study on 08/12/16.     Please call and inform patient that I have entered an order for treatment with positive airway pressure (PAP) treatment of obstructive sleep apnea (OSA). She did well during the latest sleep study with CPAP. We will, therefore, arrange for a machine for home use through a DME (durable medical equipment) company of Her choice; and I will see the patient back in follow-up in about 10 weeks, may see Aundra MilletMegan or Eber Jonesarolyn, NPs. Please also explain to the patient that I will be looking out for compliance data, which can be downloaded from the machine (stored on an SD card, that is inserted in the machine) or via remote access through a modem, that is built into the machine. At the time of the followup appointment we will discuss sleep study results and how it is going with PAP treatment at home. Please advise patient to bring Her machine at the time of the first FU visit, even though this is cumbersome. Bringing the machine for every visit after that will likely not be needed, but often helps for the first visit to troubleshoot if needed. Please re-enforce the importance of compliance with treatment and the need for us to monitor compliance data - often an insurance requirement and actually good feedback for the patient as far as how they are doing.  Also remind patient, that any interim PAP machine or mask issues should be first addressed with the DME company, as they can often help better with technical and mask fit issues. Please ask if patient has a preference regarding DME company.  Please also make sure, the patient has a follow-up appointment with me or one of our NPs in about 10 weeks from the setup date, thanks.  Once you have spoken to the patient - and faxed/routed report to PCP and referring MD (if other than PCP), you can close this encounter, thanks,    Huston FoleySaima Stepfanie Yott, MD, PhD [facility]

## 2016-08-14 NOTE — Addendum Note (Signed)
Addended by: Huston FoleyATHAR, Pernell Lenoir on: 08/14/2016 08:26 AM   Modules accepted: Orders

## 2016-08-27 ENCOUNTER — Ambulatory Visit: Payer: BLUE CROSS/BLUE SHIELD | Admitting: Psychiatry

## 2016-09-06 ENCOUNTER — Encounter: Payer: BLUE CROSS/BLUE SHIELD | Attending: Family Medicine | Admitting: Registered"

## 2016-09-06 ENCOUNTER — Encounter: Payer: Self-pay | Admitting: Registered"

## 2016-09-06 DIAGNOSIS — Z713 Dietary counseling and surveillance: Secondary | ICD-10-CM | POA: Insufficient documentation

## 2016-09-06 DIAGNOSIS — Z87891 Personal history of nicotine dependence: Secondary | ICD-10-CM | POA: Insufficient documentation

## 2016-09-06 DIAGNOSIS — E669 Obesity, unspecified: Secondary | ICD-10-CM
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
## 2016-09-06 NOTE — Patient Instructions (Addendum)
-   Aim for your previous routine with eating and physical activity once school starts back next week.   - Continue to practice CHEWING your food  (aim for 30 chews per bite or until applesauce consistency)   - Continue to practice not drinking 15 minutes before, during, and 30 minutes after each meal/snack.

## 2016-10-04 ENCOUNTER — Encounter: Payer: BLUE CROSS/BLUE SHIELD | Attending: Family Medicine | Admitting: Registered"

## 2016-10-04 ENCOUNTER — Encounter: Payer: Self-pay | Admitting: Registered"

## 2016-10-04 DIAGNOSIS — Z87891 Personal history of nicotine dependence: Secondary | ICD-10-CM | POA: Insufficient documentation

## 2016-10-04 DIAGNOSIS — Z713 Dietary counseling and surveillance: Secondary | ICD-10-CM | POA: Diagnosis present

## 2016-10-04 DIAGNOSIS — E669 Obesity, unspecified: Secondary | ICD-10-CM

## 2016-10-04 NOTE — Patient Instructions (Addendum)
-   Try Baritastic App.   - Try Donnalee Curry and Skinny Girl dressings to help tenderize meats.  - Be mindful and listen for hunger cues and cues of being satisfied.   - Aim for your previous routine with eating and physical activity.

## 2016-10-04 NOTE — Progress Notes (Signed)
Appt start time: 9:15 end time: 9:33  Assessment: 4th SWL Appointment.   Start Wt at NDES: 268.0 Wt: 277.0 BMI: 49.07   Pt arrives having gained 5.4 lbs from previous visit. Pt states she has started using CPAP machine and sleeps well 2 out of 7 nights a week. Pt states she is still getting used to sleeping with it. Pt states she will do better with getting on schedule between now and next visit related to eating regimen and physical activity. Pt states she is still working on chewing 30 times per bite. Pt states it is easier when she cuts her steak into smaller pieces but sometimes she forgets. Pt states not drinking with meals is "kicking her butt" because with most meats it is too dry even after chewing it well; she drinks to help swallow food. Pt states she does not like fish. Pt states she is teaching her youngest daughter how to cross stitch and that has been her alternative. Pt states she loves to cross stitch. Pt states she is scared that 1 week after surgery she will want to eat food. Pt was encouraged to listen for hunger and satiety cues now, as well as establishing a routine of when to eat. Pt was educated on how the the diet progresses and why. Pt was encouraged to attend support group and use her healthcare team as needed; we are here for support.   Pt states she is  getting 64 oz fluid a day. Pt is emotional because she feels she has not been doing what she is supposed to but was encouraged to continue working on goals and not be discouraged by recent weight gain. Pt was also encouraged because even with increased stress, she has continued to NOT smoke after having a 25 year habit.     MEDICATIONS: See list   DIETARY INTAKE:  24-hr recall:  B ( AM): 2 scrambled eggs, milk  Snk ( AM): none L ( PM):  2 Premier Protein shakes  Snk ( PM): none D ( PM): spaghetti or hot dogs or stir fry cauliflower and vegetables Snk ( PM): none Beverages: water, Ayeza light, 2% milk , protein  shakes  Usual physical activity: mowing grass once/wk, walking 10 min/day, 7 days/week  Diet to Follow: Calories: 1800 kcals Carbohydrate: 200g Protein: 135g Fat: 50g  Preferred Learning Style:   No preference indicated   Learning Readiness:   Ready  Change in progress     Nutritional Diagnosis:  [place]-3.3 Overweight/obesity related to past poor dietary habits and physical inactivity as evidenced by patient w/ planned sleeve gastrectomy surgery following dietary guidelines for continued weight loss.    Intervention:  Nutrition counseling for upcoming Bariatric Surgery.  Goals:  - Try Baritastic App.  - Try Donnalee Curry and Skinny Girl dressings to help tenderize meats. - Be mindful and listen for hunger cues and cues of being satisfied.  - Aim for your previous routine with eating and physical activity.    Teaching Method Utilized:  Visual Auditory  Handouts given during visit include:  none  Barriers to learning/adherence to lifestyle change: none  Demonstrated degree of understanding via:  Teach Back   Monitoring/Evaluation:  Dietary intake, exercise, and body weight in 1 month(s).

## 2016-10-17 ENCOUNTER — Ambulatory Visit: Payer: Self-pay | Admitting: Neurology

## 2016-11-01 ENCOUNTER — Encounter: Payer: BLUE CROSS/BLUE SHIELD | Attending: Family Medicine | Admitting: Registered"

## 2016-11-01 ENCOUNTER — Encounter: Payer: Self-pay | Admitting: Registered"

## 2016-11-01 DIAGNOSIS — Z713 Dietary counseling and surveillance: Secondary | ICD-10-CM | POA: Insufficient documentation

## 2016-11-01 DIAGNOSIS — E669 Obesity, unspecified: Secondary | ICD-10-CM

## 2016-11-01 DIAGNOSIS — Z87891 Personal history of nicotine dependence: Secondary | ICD-10-CM | POA: Diagnosis not present

## 2016-11-01 NOTE — Progress Notes (Signed)
Appt start time: 8:50 end time: 9:05  Assessment: 5th SWL Appointment.   Start Wt at NDES: 268.0 Wt: 277.5 BMI: 49.16   Pt arrives having maintained weight from previous visit. Pt states she has reduced eating out and has begun to eat more meals at home. Pt sates she is afraid of getting used to eating something now and then it causing problems after surgery. Pt states she is managing stress well and not emotional eating anymore. Pt reports Nov 1 marks 1 year since quitting smoking. Pt is doing well with mindfully eating and making behavioral changes.     MEDICATIONS: See list   DIETARY INTAKE:  24-hr recall:  B ( AM): premier protein Snk ( AM): 2 boiled eggs L ( PM):  Peanut butter and jelly sandwich Snk ( PM): sometimes mozzarella sticks D ( PM): chicken and rice bake  Snk ( PM): Little Debbie cake Beverages: water, [place] light, 2% milk , protein shakes  Usual physical activity: mowing grass once/wk, walking 10 min/day, 5 days/week  Diet to Follow: Calories: 1800 kcals Carbohydrate: 200g Protein: 135g Fat: 50g  Preferred Learning Style:   No preference indicated   Learning Readiness:   Ready  Change in progress     Nutritional Diagnosis:  [place]-3.3 Overweight/obesity related to past poor dietary habits and physical inactivity as evidenced by patient w/ planned sleeve gastrectomy surgery following dietary guidelines for continued weight loss.    Intervention:  Nutrition counseling for upcoming Bariatric Surgery.  Goals:  - Increase physical activity to at least 20 min, 5 days/week. 1 lap around your block in the morning and 1 lap in the afternoons.  - Aim to increase vegetable intake. Add carrots, broccoli, cauliflower, peppers as side item with lunch. - Choose bread with more fiber in it.  - Aim to continue to chew food well.  - You're doing great!    Teaching Method Utilized:  Visual Auditory  Handouts given during visit include:  none  Barriers to  learning/adherence to lifestyle change: none  Demonstrated degree of understanding via:  Teach Back   Monitoring/Evaluation:  Dietary intake, exercise, and body weight in 1 month(s).

## 2016-11-01 NOTE — Patient Instructions (Addendum)
-   Increase physical activity to at least 20 min, 5 days/week. 1 lap around your block in the morning and 1 lap in the afternoons.   - Aim to increase vegetable intake. Add carrots, broccoli, cauliflower, peppers as side item with lunch.  - Choose bread with more fiber in it.   - Aim to continue to chew food well.   - You're doing great!

## 2016-11-20 ENCOUNTER — Encounter: Payer: Self-pay | Admitting: Neurology

## 2016-11-25 ENCOUNTER — Encounter: Payer: Self-pay | Admitting: Neurology

## 2016-11-25 ENCOUNTER — Ambulatory Visit: Payer: BLUE CROSS/BLUE SHIELD | Admitting: Neurology

## 2016-11-25 VITALS — BP 139/85 | HR 63 | Ht 63.0 in | Wt 282.0 lb

## 2016-11-25 DIAGNOSIS — Z9989 Dependence on other enabling machines and devices: Secondary | ICD-10-CM | POA: Diagnosis not present

## 2016-11-25 DIAGNOSIS — G4733 Obstructive sleep apnea (adult) (pediatric): Secondary | ICD-10-CM

## 2016-11-25 NOTE — Patient Instructions (Signed)
Keep up the good work! We can see you in 6 months for sleep apnea check up, and if you continue to do well on CPAP I will see you once a year thereafter.   Please continue using your CPAP regularly. While your insurance requires that you use CPAP at least 4 hours each night on 70% of the nights, I recommend, that you not skip any nights and use it throughout the night if you can. Getting used to CPAP and staying with the treatment long term does take time and patience and discipline. Untreated obstructive sleep apnea when it is moderate to severe can have an adverse impact on cardiovascular health and raise her risk for heart disease, arrhythmias, hypertension, congestive heart failure, stroke and diabetes. Untreated obstructive sleep apnea causes sleep disruption, nonrestorative sleep, and sleep deprivation. This can have an impact on your day to day functioning and cause daytime sleepiness and impairment of cognitive function, memory loss, mood disturbance, and problems focussing. Using CPAP regularly can improve these symptoms.

## 2016-11-25 NOTE — Progress Notes (Signed)
Subjective:    Patient ID: Donna Mcintyre is a 36 y.o. female.  HPI     Interim history:   Donna Mcintyre is a 36 year old right-handed woman with an underlying medical history of kidney stone, asthma, anxiety, depression, back pain and morbid obesity with a BMI of over 86, who presents for follow-up consultation of her obstructive sleep apnea, after recent sleep testing and starting CPAP therapy at home. The patient is accompanied by her husband today. I first met her on 06/27/2016 at the request of her bariatric surgeon, at which time she reported poor sleep consolidation, AM HAs and a negative home sleep test recently, but her risk for sleep apnea based on her exam was higher. She was invited for a lab attended sleep study. She had this on 07/22/2016. Sleep latency was 29.5 minutes, sleep efficiency 72%, REM latency normal at 75 minutes. She had a total AHI of 9.7 per hour, REM AHI was 50 per hour, supine AHI 9.7 per hour, average oxygen saturation was 97%, nadir was 81%. No PLMS were seen. Given her medical history and upcoming planned surgery I suggested she be treated with CPAP. She came for a CPAP titration on 08/12/2016. Sleep efficiency was 74.5%, sleep latency delayed at 129 minutes, she was fitted with small nasal pillows and CPAP was titrated from 5 cm to 9 cm. Adequate pressure was 8 cm with AHI of 0 per hour and supine REM sleep achieved, O2 nadir of 96%. Based on her test results I prescribed CPAP therapy of 8 cm for home use.  Today, 11/25/2016: I reviewed her CPAP compliance data from 10/22/2016 through 11/20/2016 which is a total of 30 days, during which time she was machine 26 days with percent used days greater than 4 hours at 77%, indicating adequate compliance with an average usage of 5 hours and 17 minutes, residual AHI 1.3 per hour, leak low with the 95th percentile of 0.7 L/m on a pressure of 8 cm with EPR of 3. She reports doing well with CPAP. She feels like her morning  headaches are better. She feels like she is resting better. She has completed her 6 month nutrition follow-up appointments. She may be able to get a surgery date for bariatric surgery by the end of the year. She is motivated to continue with CPAP.   The patient's allergies, current medications, family history, past medical history, past social history, past surgical history and problem list were reviewed and updated as appropriate.   Previously (copied from previous notes for reference):   06/27/2016: (She) had a home sleep test in April 2018, which showed an AHI of 3 per hour, and further evaluation was recommended based on clinical history and high pretest probability of having OSA. I reviewed her home sleep test report from 04/17/2016 (report date 04/25/16). I also reviewed your office note from 05/16/2016. She is being considered for sleeve gastrectomy. She quit smoking in November 2017. She smoked for about 25 years. She has rare alcohol use, a remote history of kidney stones. Her Epworth Sleepiness Scale score is 5 out of 24, fatigue score is 37 out of 63. She lives at home with her husband and 3 children. She works at [employer]. She drinks alcohol only twice a year or so, she drinks caffeine, in the form of coffee, usually one cup per day.  She is not a good sleeper, she admits and as long a she can remember, she has had difficulty falling asleep and staying asleep for  years.  Her father has OSA. Her bedtime is typically around 10 AM but she typically may not be asleep until 1 AM. Wake up time is around 6:30 AM. She has occasional restless leg symptoms but is not sure if she moves her legs in her sleep. She has had occasional morning headaches, she has nocturia usually once per average night. She has 3 kids, ages 20, 63 and 78. Her husband also snores but she does not seem to be disturbed by it. She has 1 older brother who does not have any known history of sleep apnea. She works from 7 AM to 7 PM. She  works 5 days a week. She has an appointment pending with psychology next month.   Her Past Medical History Is Significant For: Past Medical History:  Diagnosis Date  . Anxiety   . Asthma   . Back pain   . Headache   . Kidney stone   . Morbid obesity ([place])   . Seizures ([place])     Her Past Surgical History Is Significant For: Past Surgical History:  Procedure Laterality Date  . CESAREAN SECTION    . WISDOM TOOTH EXTRACTION      Her Family History Is Significant For: Family History  Problem Relation Age of Onset  . Asthma Other   . Cancer Other   . Hypertension Other   . Heart disease Other   . Diabetes Other   . Arthritis Mother   . Alcohol abuse Father   . Arthritis Father   . Hypertension Father   . Heart failure Father   . Alcohol abuse Brother   . Heart disease Paternal Grandmother   . Cancer Paternal Grandfather     Her Social History Is Significant For: Social History   Socioeconomic History  . Marital status: Unknown    Spouse name: None  . Number of children: None  . Years of education: None  . Highest education level: None  Social Needs  . Financial resource strain: None  . Food insecurity - worry: None  . Food insecurity - inability: None  . Transportation needs - medical: None  . Transportation needs - non-medical: None  Occupational History  . None  Tobacco Use  . Smoking status: Former Research scientist (life sciences)  . Smokeless tobacco: Never Used  Substance and Sexual Activity  . Alcohol use: None  . Drug use: No  . Sexual activity: None  Other Topics Concern  . None  Social History Narrative  . None    Her Allergies Are:  Allergies  Allergen Reactions  . Levaquin [Levofloxacin In D5w] Other (See Comments)    Throat swells  :   Her Current Medications Are:  No outpatient encounter medications on file as of 11/25/2016.   No facility-administered encounter medications on file as of 11/25/2016.   :  Review of Systems:  Out of a complete 14 point  review of systems, all are reviewed and negative with the exception of these symptoms as listed below: Review of Systems  Neurological:       Pt presents today to discuss her cpap. Pt reports that her morning headaches are better.    Objective:  Neurological Exam  Physical Exam Physical Examination:   Vitals:   11/25/16 0956  BP: 139/85  Pulse: 63    General Examination: The patient is a very pleasant 36 y.o. female in no acute distress. She appears well-developed and well-nourished and well groomed.   HEENT: Normocephalic, atraumatic, pupils are equal, round and  reactive to light and accommodation. Extraocular tracking is good without limitation to gaze excursion or nystagmus noted. Normal smooth pursuit is noted. Hearing is grossly intact. Face is symmetric with normal facial animation and normal facial sensation. Speech is clear with no dysarthria noted. There is no hypophonia. There is no lip, neck/head, jaw or voice tremor. Neck is supple with full range of passive and active motion. Oropharynx exam reveals: mild mouth dryness, adequate dental hygiene and moderate airway crowding. Tongue protrudes centrally and palate elevates symmetrically.   Chest: Clear to auscultation without wheezing, rhonchi or crackles noted.  Heart: S1+S2+0, regular and normal without murmurs, rubs or gallops noted.   Abdomen: Soft, non-tender and non-distended with normal bowel sounds appreciated on auscultation.  Extremities: There is no pitting edema in the distal lower extremities bilaterally. Pedal pulses are intact.  Skin: Warm and dry without trophic changes noted. There are no varicose veins.  Musculoskeletal: exam reveals no obvious joint deformities, tenderness or joint swelling or erythema.   Neurologically:  Mental status: The patient is awake, alert and oriented in all 4 spheres. Her immediate and remote memory, attention, language skills and fund of knowledge are appropriate. There  is no evidence of aphasia, agnosia, apraxia or anomia. Speech is clear with normal prosody and enunciation. Thought process is linear. Mood is normal and affect is normal.  Cranial nerves II - XII are as described above under HEENT exam.  Motor exam: Normal bulk, strength and tone is noted. There is no drift, tremor or rebound. Romberg is negative. Reflexes are 2+ throughout. Babinski: Toes are flexor bilaterally. Fine motor skills and coordination: intact with normal finger taps, normal hand movements, normal rapid alternating patting, normal foot taps and normal foot agility.  Cerebellar testing: No dysmetria or intention tremor on finger to nose testing. Heel to shin is unremarkable bilaterally. There is no truncal or gait ataxia.  Sensory exam: intact to light touch in the upper and lower extremities.  Gait, station and balance: She stands easily. No veering to one side is noted. No leaning to one side is noted. Posture is age-appropriate and stance is narrow based. Gait shows normal stride length and normal pace. No problems turning are noted. Tandem walk is unremarkable.   Assessment and Plan:  In summary, Donna Mcintyre is a very pleasant 36 year old female with an underlying medical history of kidney stone, asthma, anxiety, depression, back pain and morbid obesity with a BMI of over 40, who presents for follow-up consultation of her obstructive sleep apnea, after recent sleep testing and starting CPAP therapy at home. She has overall mild sleep apnea, with significant REM predominance. She has done well with CPAP therapy, indicates good results including better sleep consolidation, better sleep quality and reduction in morning headaches. She is commended for her treatment adherence and encouraged to continue with it. She has no problems tolerating the nasal pillows and the pressure. I suggested she continue with treatment at the current settings. She is doing well from my end of things. She may  have bariatric surgery soon. Routinely, we will see her back in about 6 months and then yearly thereafter. With appropriate weight loss she may be able to get reevaluated for her sleep apnea down the Road.  I answered all their questions today and the patient and her husband were in agreement with the plan.   I spent 25 minutes in total face-to-face time with the patient, more than 50% of which was spent in counseling and coordination  of care, reviewing test results, reviewing medication and discussing or reviewing the diagnosis of OSA, its prognosis and treatment options. Pertinent laboratory and imaging test results that were available during this visit with the patient were reviewed by me and considered in my medical decision making (see chart for details).

## 2016-12-02 ENCOUNTER — Encounter: Payer: BLUE CROSS/BLUE SHIELD | Attending: Family Medicine | Admitting: Registered"

## 2016-12-02 ENCOUNTER — Encounter: Payer: Self-pay | Admitting: Registered"

## 2016-12-02 DIAGNOSIS — Z87891 Personal history of nicotine dependence: Secondary | ICD-10-CM | POA: Diagnosis not present

## 2016-12-02 DIAGNOSIS — E669 Obesity, unspecified: Secondary | ICD-10-CM

## 2016-12-02 DIAGNOSIS — Z713 Dietary counseling and surveillance: Secondary | ICD-10-CM | POA: Diagnosis present

## 2016-12-02 NOTE — Progress Notes (Signed)
Appt start time: 9:15 end time: 9:30  Assessment: 6th [facility] Appointment.   Start Wt at [facility]: 268.0 Wt: 285.4 BMI: 50.56   Pt arrives with husband having gained about ~7.9 lbs from previous visit. Pt states she and family have joined [employer]; doing cardio and strength-training. Pt states she does not want to lose muscle mass after having surgery. Pt states she has been trying new recipes from [employer]; eating fried cauliflower rice as alternative. Pt just celebrated 1 year since quitting smoking on Nov. 1; used Chantix for 2 months to help. Pt states she cannot stand the smell of cigarettes anymore. Pt states she is still struggling sometimes with not drinking with meals, stating some days are better than others.   Pt sates she is afraid of getting used to eating something now and then it causing problems after surgery. Pt states she is managing stress well and not emotional eating anymore. Pt is doing well with mindfully eating and making behavioral changes.     MEDICATIONS: See list   DIETARY INTAKE:  24-hr recall:  B ( AM): premier protein Snk ( AM): 2 boiled eggs L ( PM):  Peanut butter and jelly sandwich Snk ( PM): sometimes mozzarella sticks D ( PM): chicken and rice bake  Snk ( PM): Little Debbie cake Beverages: water, Avrie light, 2% milk , protein shakes  Usual physical activity: cardio and strength-training 45-60 min, 3x/week  Diet to Follow: Calories: 1800 kcals Carbohydrate: 200g Protein: 135g Fat: 50g  Preferred Learning Style:   No preference indicated   Learning Readiness:   Ready  Change in progress     Nutritional Diagnosis:  [place]-3.3 Overweight/obesity related to past poor dietary habits and physical inactivity as evidenced by patient w/ planned sleeve gastrectomy surgery following dietary guidelines for continued weight loss.    Intervention:  Nutrition counseling for upcoming Bariatric Surgery.  Goals:  - Continue to aim to not drink  15 minutes, not while eating, and waiting 30 minutes after eating.  - Aim to prepare protein food to be moist and tender. May use Skinny Girl dressings and  [employer].  - Keep up the great work!   Teaching Method Utilized:  Visual Auditory  Handouts given during visit include:  none  Barriers to learning/adherence to lifestyle change: none  Demonstrated degree of understanding via:  Teach Back   Monitoring/Evaluation:  Dietary intake, exercise, and body weight prn.

## 2016-12-02 NOTE — Patient Instructions (Addendum)
-   Continue to aim to not drink 15 minutes, not while eating, and waiting 30 minutes after eating.   - Aim to prepare protein food to be moist and tender. May use Skinny Girl dressings and  [employer].    - Keep up the great work!

## 2016-12-23 ENCOUNTER — Ambulatory Visit: Payer: Self-pay

## 2017-01-27 ENCOUNTER — Encounter: Payer: BLUE CROSS/BLUE SHIELD | Attending: Family Medicine | Admitting: Skilled Nursing Facility1

## 2017-01-27 DIAGNOSIS — Z713 Dietary counseling and surveillance: Secondary | ICD-10-CM | POA: Insufficient documentation

## 2017-01-27 DIAGNOSIS — Z87891 Personal history of nicotine dependence: Secondary | ICD-10-CM | POA: Diagnosis not present

## 2017-01-27 DIAGNOSIS — E669 Obesity, unspecified: Secondary | ICD-10-CM

## 2017-01-29 ENCOUNTER — Encounter: Payer: Self-pay | Admitting: Skilled Nursing Facility1

## 2017-01-29 NOTE — Progress Notes (Signed)
Pre-Operative Nutrition Class:  Appt start time: 0746   End time:  1830.  Patient was seen on 01/27/2017 for Pre-Operative Bariatric Surgery Education at [facility].   Surgery date:  Surgery type: sleeve Start weight at [employer]: 268 Weight today: 287.1  Samples given per MNT protocol. Patient educated on appropriate usage: Bariatric Advantage Multivitamin Lot # [id number] Exp: 07/2017  Bariatric Advantage Calcium  Lot # [id number] Exp:09-21-17  Unjury Protein Shake Lot # [id number] Exp: 10-26-17  The following the learning objectives were met by the patient during this course:  Identify Pre-Op Dietary Goals and will begin 2 weeks pre-operatively  Identify appropriate sources of fluids and proteins   State protein recommendations and appropriate sources pre and post-operatively  Identify Post-Operative Dietary Goals and will follow for 2 weeks post-operatively  Identify appropriate multivitamin and calcium sources  Describe the need for physical activity post-operatively and will follow MD recommendations  State when to call healthcare provider regarding medication questions or post-operative complications  Handouts given during class include:  Pre-Op Bariatric Surgery Diet Handout  Protein Shake Handout  Post-Op Bariatric Surgery Nutrition Handout  [facility] Program Information Flyer  Support Group Information Flyer  [facility] Bariatric Supplements Price List  [facility] Plan: [facility] advancement per MD.

## 2017-04-16 ENCOUNTER — Telehealth: Payer: Self-pay | Admitting: Skilled Nursing Facility1

## 2017-04-16 NOTE — Telephone Encounter (Signed)
Dietitian called pt due to her having some questions about the pre-op diet.  Pt states she could not remember how long she needed to be on the liquid diet prior to surgery.  Dietitian educated pt on the appropriate diet before surgry. Focus on a protein shake for breakfast, lean protein and non-starchy vegetables with lunch and dinner; and maybe another protein shake for cravings/snacking.    Pt stated she knew some surgeons put their pts on a liquid diet prior to surgery.  Dietitian educated th pt on the fact that a liquid diet prior to surgery is not apart of [facility]'s program. Dietitian advised the pt she was most likely getting confused with things her friends have told her and Facebook and pt agreed to this sentiment. Pt states she remembers pre-op diet but could not remember the information from it, pt did state she still has the materials from pre-op but has not looked at it since pre-op class. Dietitian advised she just focus on the pre-op diet and not worry about post-op until after she has surgery.

## 2017-05-01 ENCOUNTER — Ambulatory Visit: Payer: Self-pay | Admitting: General Surgery

## 2017-05-01 NOTE — H&P (View-Only) (Signed)
Donna Mcintyre Documented: 04/16/2017 9:58 AM [facility] Patient #: [id number] DOB: 17-Aug-1980 Married / Language: English / Race: White Female   History of Present Illness Donna Areola M. Lamere Lightner MD; 04/29/2017 10:14 AM) The patient is a 37 year old female who presents for a bariatric surgery evaluation. She comes in for a long term follow up appointment. She has completed supervised weight loss since the first time I saw her about a year ago. She underwent a sleep study and was found to have obstructive sleep apnea and was placed on CPAP. She had a case of bronchitis about a month ago and was placed on antibiotic and short prednisone taper. She had an UGI which showed a small hiatal hernia and some delay in gastric emptying but the tablet went well. She denies any tobacco use. She denies any reflux/heartburn. Otherwise she denies any changes.  05/2016 -- She is referred by Dr Leonor Liv to discuss weight loss surgery. She completed our seminar on line. She is interested in a sleeve gastrectomy since it does not involve rerouting of the intestines. She wants to improve her overall health and be able to keep up with her children. She has struggled with her weight her entire life. Despite numerous attempts for sustained weight loss she's been unsuccessful. She has tried Vickey Sages, and 2 years of physical activity of going to the gym regularly along with portion control-all without any long-term success. She was able to lose 30 pounds but regained 35 pounds  She denies any chest tightness. She occasionally may have some sternal pressure but it does not radiate to her jaw or left arm it is not associated with nausea, sweating, lightheadedness or dizziness. She denies any orthopnea, paroxysmal nocturnal dyspnea. She denies any personal or family history of blood clots. She denies any shortness of breath.  She states that she had a home sleep study which showed no evidence of sleep  apnea. However she answered positively to 3 questions as part of the STOP part of the STOP-bang questionnaire. She denies any heartburn or reflux. She denies any indigestion. She denies any melena or hematochezia. She denies any abdominal pain. She has bowel movements every other day. She has had 3 C-sections. She denies any melena or hematochezia. She occasionally feels like she doesn't completely empty her bladder. She denies any stress urinary incontinence. She has bilateral ankle pain. She denies any TIAs or amaurosis fugax. She denies any frequent migraines. She had a remote history of problems with kidney stones but cut back on her tea consumption and since then has had no kidney stone issues. She is a former smoker. She quit in November 2017. Prior to that she smoked for 25 years. She may drink alcohol twice a year. She denies any drug use. 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 Andrey Campanile, MD; 04/29/2017 10:21 AM) Cesarean Section - Multiple  Oral Surgery   Diagnostic Studies History Donna Areola M. Andrey Campanile, MD; 04/29/2017 10:21 AM) Colonoscopy  never Mammogram  never Pap Smear  1-5 years ago  Allergies Maurilio Lovely; 04/16/2017 10:01 AM) Levaquin *FLUOROQUINOLONES*  Allergies Reconciled   Medication History Donna Areola M. Andrey Campanile, MD; 04/29/2017 10:21 AM) Medications Reconciled OxyCODONE HCl (5MG /5ML Solution, 5-10 Oral every four hours, as needed, Taken starting 04/16/2017) Active. No Current Medications (Taken starting 04/29/2017) Protonix (  40MG  Tablet DR, 1 (one) Oral daily, Taken starting 04/16/2017) Active. Ondansetron (4MG  Tablet Disint, 1 (one) Oral every six hours, as needed, Taken starting 04/16/2017) Active.  Social History Donna Areola M. Andrey Campanile, MD; 04/29/2017 10:21 AM) Alcohol  use  Remotely quit alcohol use. Caffeine use  Coffee. Illicit drug use  Remotely quit drug use. Tobacco use  Former smoker.  Family History Donna Areola M. Andrey Campanile, MD; 04/29/2017 10:21 AM) Alcohol Abuse  Brother, Father. Anesthetic complications  Family Members In General. Arthritis  Father, Mother. Depression  Mother. Hypertension  Father. Migraine Headache  Mother. Respiratory Condition  Father.  Pregnancy / Birth History Donna Areola M. Andrey Campanile, MD; 04/29/2017 10:21 AM) Age at menarche  8 years. Gravida  3 Maternal age  34-20 Para  3 Regular periods   Other Problems Donna Areola M. Andrey Campanile, MD; 04/29/2017 10:21 AM) Anxiety Disorder  Asthma  Back Pain  Kidney Stone  Migraine Headache  Seizure Disorder  Transfusion history     Review of Systems Donna Areola M. Peyten Weare MD; 04/29/2017 10:06 AM) General Not Present- Appetite Loss, Chills, Fatigue, Fever, Night Sweats, Weight Gain and Weight Loss. Skin Not Present- Change in Wart/Mole, Dryness, Hives, Jaundice, New Lesions, Non-Healing Wounds, Rash and Ulcer. HEENT Present- Seasonal Allergies. Not Present- Earache, Hearing Loss, Hoarseness, Nose Bleed, Oral Ulcers, Ringing in the Ears, Sinus Pain, Sore Throat, Visual Disturbances, Wears glasses/contact lenses and Yellow Eyes. Respiratory Present- Snoring. Not Present- Bloody sputum, Chronic Cough, Difficulty Breathing and Wheezing. Breast Not Present- Breast Mass, Breast Pain, Nipple Discharge and Skin Changes. Cardiovascular Not Present- Chest Pain, Difficulty Breathing Lying Down, Leg Cramps, Palpitations, Rapid Heart Rate, Shortness of Breath and Swelling of Extremities. Gastrointestinal Not Present- Abdominal Pain, Bloating, Bloody Stool, Change in Bowel Habits, Chronic diarrhea, Constipation, Difficulty Swallowing, Excessive gas, Gets full quickly at meals, Hemorrhoids, Indigestion, Nausea, Rectal Pain and Vomiting. Female Genitourinary Not Present- Frequency, Nocturia, Painful  Urination, Pelvic Pain and Urgency. Musculoskeletal Present- Back Pain and Joint Stiffness. Not Present- Joint Pain, Muscle Pain, Muscle Weakness and Swelling of Extremities. Psychiatric Not Present- Anxiety, Bipolar, Change in Sleep Pattern, Depression, Fearful and Frequent crying. Endocrine Not Present- Cold Intolerance, Excessive Hunger, Hair Changes, Heat Intolerance, Hot flashes and New Diabetes. Hematology Not Present- Blood Thinners, Easy Bruising, Excessive bleeding, Gland problems, HIV and Persistent Infections.  Vitals Maurilio Lovely; 04/16/2017 10:02 AM) 04/16/2017 10:01 AM Weight: 293.19 lb Height: 64in Body Surface Area: 2.3 m Body Mass Index: 50.33 kg/m  Temp.: 98.59F  Pulse: 81 (Regular)  BP: 134/74 (Sitting, Left Arm, Standard)       Physical Exam Donna Areola M. Kyleen Villatoro MD; 04/29/2017 10:06 AM) General Mental Status-Alert. General Appearance-Consistent with stated age. Hydration-Well hydrated. Voice-Normal. Note: morbidly obese; central truncal   Head and Neck Head-normocephalic, atraumatic with no lesions or palpable masses. Trachea-midline. Thyroid Gland Characteristics - normal size and consistency.  Eye Eyeball - Bilateral-Extraocular movements intact. Sclera/Conjunctiva - Bilateral-No scleral icterus.  ENMT Note: normal external ears lips intact   Chest and Lung Exam Chest and lung exam reveals -quiet, even and easy respiratory effort with no use of accessory muscles and on auscultation, normal breath sounds, no adventitious sounds and normal vocal resonance. Inspection Chest Wall - Normal. Back - normal.  Breast - Did not examine.  Cardiovascular Cardiovascular examination reveals -normal heart sounds, regular rate and rhythm with no murmurs and normal pedal pulses bilaterally.  Abdomen Inspection Inspection of the abdomen reveals - No Hernias. Skin - Scar - no surgical scars. Palpation/Percussion Palpation and  Percussion of the abdomen  reveal - Soft, Non Tender, No Rebound tenderness, No Rigidity (guarding) and No hepatosplenomegaly. Auscultation Auscultation of the abdomen reveals - Bowel sounds normal.  Peripheral Vascular Upper Extremity Palpation - Pulses bilaterally normal.  Neurologic Neurologic evaluation reveals -alert and oriented x 3 with no impairment of recent or remote memory. Mental Status-Normal.  Neuropsychiatric The patient's mood and affect are described as -normal. Judgment and Insight-insight is appropriate concerning matters relevant to self.  Musculoskeletal Normal Exam - Left-Upper Extremity Strength Normal and Lower Extremity Strength Normal. Normal Exam - Right-Upper Extremity Strength Normal and Lower Extremity Strength Normal.  Lymphatic Head & Neck  General Head & Neck Lymphatics: Bilateral - Description - Normal. Axillary - Did not examine. Femoral & Inguinal - Did not examine.    Assessment & Plan Donna Areola(Admir Candelas M. Leory Allinson MD; 04/29/2017 10:18 AM) MORBID OBESITY (E66.01) Impression: The patient meets weight loss surgery criteria. I think the patient would be an acceptable candidate for Laparoscopic vertical sleeve gastrectomy.  We rediscussed LSG. We rediscussed the preoperative, operative and postoperative process. Using diagrams, I explained the surgery in detail including the performance of an EGD near the end of the surgery. We discussed the typical hospital course including a 2-3 day stay baring any complications. The patient was given educational material. I quoted the patient that most patients can lose up to 50-55% of their excess weight. We did discuss the possibility of weight regain several years after the procedure.  We had previously discussed the risks of infection, bleeding, pain, scarring, weight regain, too little or too much weight loss, vitamin deficiencies and need for lifelong vitamin supplementation, hair loss, need for protein  supplementation, leaks, stricture, reflux, food intolerance, gallstone formation, hernia, need for reoperation, need for open surgery, injury to spleen or surrounding structures, DVT's, PE, and death again discussed with the patient and the patient expressed understanding and desires to proceed with laparoscopic vertical sleeve gastrectomy, possible open, intraoperative endoscopy. Current Plans Started OxyCODONE HCl 5 MG/5ML Oral Solution, 5-10 Milliliter every four hours, as needed, 75 Milliliter, 04/16/2017, No Refill. Started Protonix 40 MG Oral Tablet Delayed Release, 1 (one) Tablet daily, #30, 04/16/2017, Ref. x2. Started Ondansetron 4 MG Oral Tablet Disintegrating, 1 (one) Tablet every six hours, as needed, #15, 04/16/2017, No Refill. OSA ON CPAP (G47.33) HIATAL HERNIA (K44.9) Impression: We discussed the finding of a small hiatal hernia on her UGI. I advised her we would test for one intraoperatively and if found to have one we would repair it. We discussed what that would involve including the steps. We discussed that even with hiatal hernia repair it would not necessarily preclude development of reflux/heartburn longterm after sleeve gastrectomy.  Addendum Note(Kara Melching M. Marcelle Bebout MD; 05/01/2017 4:39 PM) We were able to get a copy of her labs that we have initially ordered in May 2018. Her CBC was unremarkable. No anemia. Comprehensive metabolic panel was normal except for a mildly elevated blood glucose level of 105. Lipid panel showed an LDL of 105. Hemoglobin A1c 5.8. TSH low normal. Vitamin D insufficiency with a level of 21.1 vitamin B1, B12, and iron normal  Mary SellaEric M. Andrey CampanileWilson, MD, FACS General, Bariatric, & Minimally Invasive Surgery [facility] [place]

## 2017-05-01 NOTE — H&P (Signed)
Donna Mcintyre Documented: 04/16/2017 9:58 AM Location: [facility] Patient #: [id number] DOB: 17-Aug-1980 Married / Language: English / Race: White Female   History of Present Illness Donna Areola M. Donna Bahe MD; 04/29/2017 10:14 AM) The patient is a 37 year old female who presents for a bariatric surgery evaluation. She comes in for a long term follow up appointment. She has completed supervised weight loss since the first time I saw her about a year ago. She underwent a sleep study and was found to have obstructive sleep apnea and was placed on CPAP. She had a case of bronchitis about a month ago and was placed on antibiotic and short prednisone taper. She had an UGI which showed a small hiatal hernia and some delay in gastric emptying but the tablet went well. She denies any tobacco use. She denies any reflux/heartburn. Otherwise she denies any changes.  05/2016 -- She is referred by Dr Leonor Liv to discuss weight loss surgery. She completed our seminar on line. She is interested in a sleeve gastrectomy since it does not involve rerouting of the intestines. She wants to improve her overall health and be able to keep up with her children. She has struggled with her weight her entire life. Despite numerous attempts for sustained weight loss she's been unsuccessful. She has tried Vickey Sages, and 2 years of physical activity of going to the gym regularly along with portion control-all without any long-term success. She was able to lose 30 pounds but regained 35 pounds  She denies any chest tightness. She occasionally may have some sternal pressure but it does not radiate to her jaw or left arm it is not associated with nausea, sweating, lightheadedness or dizziness. She denies any orthopnea, paroxysmal nocturnal dyspnea. She denies any personal or family history of blood clots. She denies any shortness of breath.  She states that she had a home sleep study which showed no evidence of sleep  apnea. However she answered positively to 3 questions as part of the STOP part of the STOP-bang questionnaire. She denies any heartburn or reflux. She denies any indigestion. She denies any melena or hematochezia. She denies any abdominal pain. She has bowel movements every other day. She has had 3 C-sections. She denies any melena or hematochezia. She occasionally feels like she doesn't completely empty her bladder. She denies any stress urinary incontinence. She has bilateral ankle pain. She denies any TIAs or amaurosis fugax. She denies any frequent migraines. She had a remote history of problems with kidney stones but cut back on her tea consumption and since then has had no kidney stone issues. She is a former smoker. She quit in November 2017. Prior to that she smoked for 25 years. She may drink alcohol twice a year. She denies any drug use. 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 Andrey Campanile, MD; 04/29/2017 10:21 AM) Cesarean Section - Multiple  Oral Surgery   Diagnostic Studies History Donna Areola M. Andrey Campanile, MD; 04/29/2017 10:21 AM) Colonoscopy  never Mammogram  never Pap Smear  1-5 years ago  Allergies Donna Mcintyre; 04/16/2017 10:01 AM) Levaquin *FLUOROQUINOLONES*  Allergies Reconciled   Medication History Donna Areola M. Andrey Campanile, MD; 04/29/2017 10:21 AM) Medications Reconciled OxyCODONE HCl (5MG /5ML Solution, 5-10 Oral every four hours, as needed, Taken starting 04/16/2017) Active. No Current Medications (Taken starting 04/29/2017) Protonix (  40MG  Tablet DR, 1 (one) Oral daily, Taken starting 04/16/2017) Active. Ondansetron (4MG  Tablet Disint, 1 (one) Oral every six hours, as needed, Taken starting 04/16/2017) Active.  Social History Donna Areola M. Andrey Campanile, MD; 04/29/2017 10:21 AM) Alcohol  use  Remotely quit alcohol use. Caffeine use  Coffee. Illicit drug use  Remotely quit drug use. Tobacco use  Former smoker.  Family History Donna Areola M. Andrey Campanile, MD; 04/29/2017 10:21 AM) Alcohol Abuse  Brother, Father. Anesthetic complications  Family Members In General. Arthritis  Father, Mother. Depression  Mother. Hypertension  Father. Migraine Headache  Mother. Respiratory Condition  Father.  Pregnancy / Birth History Donna Areola M. Andrey Campanile, MD; 04/29/2017 10:21 AM) Age at menarche  8 years. Gravida  3 Maternal age  34-20 Para  3 Regular periods   Other Problems Donna Areola M. Andrey Campanile, MD; 04/29/2017 10:21 AM) Anxiety Disorder  Asthma  Back Pain  Kidney Stone  Migraine Headache  Seizure Disorder  Transfusion history     Review of Systems Donna Areola M. Adayah Arocho MD; 04/29/2017 10:06 AM) General Not Present- Appetite Loss, Chills, Fatigue, Fever, Night Sweats, Weight Gain and Weight Loss. Skin Not Present- Change in Wart/Mole, Dryness, Hives, Jaundice, New Lesions, Non-Healing Wounds, Rash and Ulcer. HEENT Present- Seasonal Allergies. Not Present- Earache, Hearing Loss, Hoarseness, Nose Bleed, Oral Ulcers, Ringing in the Ears, Sinus Pain, Sore Throat, Visual Disturbances, Wears glasses/contact lenses and Yellow Eyes. Respiratory Present- Snoring. Not Present- Bloody sputum, Chronic Cough, Difficulty Breathing and Wheezing. Breast Not Present- Breast Mass, Breast Pain, Nipple Discharge and Skin Changes. Cardiovascular Not Present- Chest Pain, Difficulty Breathing Lying Down, Leg Cramps, Palpitations, Rapid Heart Rate, Shortness of Breath and Swelling of Extremities. Gastrointestinal Not Present- Abdominal Pain, Bloating, Bloody Stool, Change in Bowel Habits, Chronic diarrhea, Constipation, Difficulty Swallowing, Excessive gas, Gets full quickly at meals, Hemorrhoids, Indigestion, Nausea, Rectal Pain and Vomiting. Female Genitourinary Not Present- Frequency, Nocturia, Painful  Urination, Pelvic Pain and Urgency. Musculoskeletal Present- Back Pain and Joint Stiffness. Not Present- Joint Pain, Muscle Pain, Muscle Weakness and Swelling of Extremities. Psychiatric Not Present- Anxiety, Bipolar, Change in Sleep Pattern, Depression, Fearful and Frequent crying. Endocrine Not Present- Cold Intolerance, Excessive Hunger, Hair Changes, Heat Intolerance, Hot flashes and New Diabetes. Hematology Not Present- Blood Thinners, Easy Bruising, Excessive bleeding, Gland problems, HIV and Persistent Infections.  Vitals Donna Mcintyre; 04/16/2017 10:02 AM) 04/16/2017 10:01 AM Weight: 293.19 lb Height: 64in Body Surface Area: 2.3 m Body Mass Index: 50.33 kg/m  Temp.: 98.59F  Pulse: 81 (Regular)  BP: 134/74 (Sitting, Left Arm, Standard)       Physical Exam Donna Areola M. Shameka Aggarwal MD; 04/29/2017 10:06 AM) General Mental Status-Alert. General Appearance-Consistent with stated age. Hydration-Well hydrated. Voice-Normal. Note: morbidly obese; central truncal   Head and Neck Head-normocephalic, atraumatic with no lesions or palpable masses. Trachea-midline. Thyroid Gland Characteristics - normal size and consistency.  Eye Eyeball - Bilateral-Extraocular movements intact. Sclera/Conjunctiva - Bilateral-No scleral icterus.  ENMT Note: normal external ears lips intact   Chest and Lung Exam Chest and lung exam reveals -quiet, even and easy respiratory effort with no use of accessory muscles and on auscultation, normal breath sounds, no adventitious sounds and normal vocal resonance. Inspection Chest Wall - Normal. Back - normal.  Breast - Did not examine.  Cardiovascular Cardiovascular examination reveals -normal heart sounds, regular rate and rhythm with no murmurs and normal pedal pulses bilaterally.  Abdomen Inspection Inspection of the abdomen reveals - No Hernias. Skin - Scar - no surgical scars. Palpation/Percussion Palpation and  Percussion of the abdomen  reveal - Soft, Non Tender, No Rebound tenderness, No Rigidity (guarding) and No hepatosplenomegaly. Auscultation Auscultation of the abdomen reveals - Bowel sounds normal.  Peripheral Vascular Upper Extremity Palpation - Pulses bilaterally normal.  Neurologic Neurologic evaluation reveals -alert and oriented x 3 with no impairment of recent or remote memory. Mental Status-Normal.  Neuropsychiatric The patient's mood and affect are described as -normal. Judgment and Insight-insight is appropriate concerning matters relevant to self.  Musculoskeletal Normal Exam - Left-Upper Extremity Strength Normal and Lower Extremity Strength Normal. Normal Exam - Right-Upper Extremity Strength Normal and Lower Extremity Strength Normal.  Lymphatic Head & Neck  General Head & Neck Lymphatics: Bilateral - Description - Normal. Axillary - Did not examine. Femoral & Inguinal - Did not examine.    Assessment & Plan Donna Areola(Ruxin Ransome M. Aaryana Betke MD; 04/29/2017 10:18 AM) MORBID OBESITY (E66.01) Impression: The patient meets weight loss surgery criteria. I think the patient would be an acceptable candidate for Laparoscopic vertical sleeve gastrectomy.  We rediscussed LSG. We rediscussed the preoperative, operative and postoperative process. Using diagrams, I explained the surgery in detail including the performance of an EGD near the end of the surgery. We discussed the typical hospital course including a 2-3 day stay baring any complications. The patient was given educational material. I quoted the patient that most patients can lose up to 50-55% of their excess weight. We did discuss the possibility of weight regain several years after the procedure.  We had previously discussed the risks of infection, bleeding, pain, scarring, weight regain, too little or too much weight loss, vitamin deficiencies and need for lifelong vitamin supplementation, hair loss, need for protein  supplementation, leaks, stricture, reflux, food intolerance, gallstone formation, hernia, need for reoperation, need for open surgery, injury to spleen or surrounding structures, DVT's, PE, and death again discussed with the patient and the patient expressed understanding and desires to proceed with laparoscopic vertical sleeve gastrectomy, possible open, intraoperative endoscopy. Current Plans Started OxyCODONE HCl 5 MG/5ML Oral Solution, 5-10 Milliliter every four hours, as needed, 75 Milliliter, 04/16/2017, No Refill. Started Protonix 40 MG Oral Tablet Delayed Release, 1 (one) Tablet daily, #30, 04/16/2017, Ref. x2. Started Ondansetron 4 MG Oral Tablet Disintegrating, 1 (one) Tablet every six hours, as needed, #15, 04/16/2017, No Refill. OSA ON CPAP (G47.33) HIATAL HERNIA (K44.9) Impression: We discussed the finding of a small hiatal hernia on her UGI. I advised her we would test for one intraoperatively and if found to have one we would repair it. We discussed what that would involve including the steps. We discussed that even with hiatal hernia repair it would not necessarily preclude development of reflux/heartburn longterm after sleeve gastrectomy.  Addendum Note(Donna Doody M. Caralynn Gelber MD; 05/01/2017 4:39 PM) We were able to get a copy of her labs that we have initially ordered in May 2018. Her CBC was unremarkable. No anemia. Comprehensive metabolic panel was normal except for a mildly elevated blood glucose level of 105. Lipid panel showed an LDL of 105. Hemoglobin A1c 5.8. TSH low normal. Vitamin D insufficiency with a level of 21.1 vitamin B1, B12, and iron normal  Donna SellaEric M. Andrey CampanileWilson, MD, FACS General, Bariatric, & Minimally Invasive Surgery [facility] [place]

## 2017-05-05 NOTE — Progress Notes (Signed)
06-11-16 (Epic) EKG, CXR

## 2017-05-05 NOTE — Patient Instructions (Addendum)
Donna Mcintyre  05/05/2017   Your procedure is scheduled on: 05-13-17   Report to [facility] Main  Entrance Report to Admitting at 11:45 AM    Call this number if you have problems the morning of surgery [phone]    Remember: MORNING OF SURGERY DRINK:  1SHAKE BEFORE YOU LEAVE HOME, DRINK ALL OF THE SHAKE AT ONE TIME.   NO SOLID FOOD AFTER 600 PM THE NIGHT BEFORE YOUR SURGERY. YOU MAY DRINK CLEAR FLUIDS. THE SHAKE YOU DRINK BEFORE YOU LEAVE HOME WILL BE THE LAST FLUIDS YOU DRINK BEFORE SURGERY.  PAIN IS EXPECTED AFTER SURGERY AND WILL NOT BE COMPLETELY ELIMINATED. AMBULATION AND TYLENOL WILL HELP REDUCE INCISIONAL AND GAS PAIN. MOVEMENT IS KEY!  YOU ARE EXPECTED TO BE OUT OF BED WITHIN 4 HOURS OF ADMISSION TO YOUR PATIENT ROOM.  SITTING IN THE RECLINER THROUGHOUT THE DAY IS IMPORTANT FOR DRINKING FLUIDS AND MOVING GAS THROUGHOUT THE GI TRACT.  COMPRESSION STOCKINGS SHOULD BE WORN [facility] STAY UNLESS YOU ARE WALKING.   INCENTIVE SPIROMETER SHOULD BE USED EVERY HOUR WHILE AWAKE TO DECREASE POST-OPERATIVE COMPLICATIONS SUCH AS PNEUMONIA.  WHEN DISCHARGED HOME, IT IS IMPORTANT TO CONTINUE TO WALK EVERY HOUR AND USE THE INCENTIVE SPIROMETER EVERY HOUR.      CLEAR LIQUID DIET   Foods Allowed                                                                     Foods Excluded  Coffee and tea, regular and decaf                             liquids that you cannot  Plain Jell-O in any flavor                                             see through such as: Fruit ices (not with fruit pulp)                                     milk, soups, orange juice  Iced Popsicles                                    All solid food Carbonated beverages, regular and diet                                    Cranberry, grape and apple juices Sports drinks like Gatorade Lightly seasoned clear broth or consume(fat free) Sugar, honey syrup  Sample Menu Breakfast                                 Lunch  Supper Cranberry juice                    Beef broth                            Chicken broth Jell-O                                     Grape juice                           Apple juice Coffee or tea                        Jell-O                                      Popsicle                                                Coffee or tea                        Coffee or tea  _____________________________________________________________________       Take these medicines the morning of surgery with A SIP OF WATER: None                                You may not have any metal on your body including hair pins and              piercings  Do not wear jewelry, make-up, lotions, powders or perfumes, deodorant             Do not wear nail polish.  Do not shave  48 hours prior to surgery.                Do not bring valuables to the hospital. [facility] IS NOT             RESPONSIBLE   FOR VALUABLES.  Contacts, dentures or bridgework may not be worn into surgery.  Leave suitcase in the car. After surgery it may be brought to your room.   P,ease bring your mask and tubing for your CPAP machine              Please read over the following fact sheets you were given: _____________________________________________________________________          [facility] - Preparing for Surgery Before surgery, you can play an important role.  Because skin is not sterile, your skin needs to be as free of germs as possible.  You can reduce the number of germs on your skin by washing with CHG (chlorahexidine gluconate) soap before surgery.  CHG is an antiseptic cleaner which kills germs and bonds with the skin to continue killing germs even after washing. Please DO NOT use if you have an allergy to CHG or antibacterial soaps.  If your skin becomes reddened/irritated stop using the CHG and inform your nurse when you arrive at Short Stay. Do not  shave (including legs and underarms) for at least 48 hours prior to the first CHG shower.  You may shave your face/neck. Please follow these instructions carefully:  1.  Shower with CHG Soap the night before surgery and the  morning of Surgery.  2.  If you choose to wash your hair, wash your hair first as usual with your  normal  shampoo.  3.  After you shampoo, rinse your hair and body thoroughly to remove the  shampoo.                           4.  Use CHG as you would any other liquid soap.  You can apply chg directly  to the skin and wash                       Gently with a scrungie or clean washcloth.  5.  Apply the CHG Soap to your body ONLY FROM THE NECK DOWN.   Do not use on face/ open                           Wound or open sores. Avoid contact with eyes, ears mouth and genitals (private parts).                       Wash face,  Genitals (private parts) with your normal soap.             6.  Wash thoroughly, paying special attention to the area where your surgery  will be performed.  7.  Thoroughly rinse your body with warm water from the neck down.  8.  DO NOT shower/wash with your normal soap after using and rinsing off  the CHG Soap.                9.  Pat yourself dry with a clean towel.            10.  Wear clean pajamas.            11.  Place clean sheets on your bed the night of your first shower and do not  sleep with pets. Day of Surgery : Do not apply any lotions/deodorants the morning of surgery.  Please wear clean clothes to the hospital/surgery center.  FAILURE TO FOLLOW THESE INSTRUCTIONS MAY RESULT IN THE CANCELLATION OF YOUR SURGERY PATIENT SIGNATURE_________________________________  NURSE SIGNATURE__________________________________  ________________________________________________________________________  WHAT IS A BLOOD TRANSFUSION? Blood Transfusion Information  A transfusion is the replacement of blood or some of its parts. Blood is made up of multiple cells  which provide different functions.  Red blood cells carry oxygen and are used for blood loss replacement.  White blood cells fight against infection.  Platelets control bleeding.  Plasma helps clot blood.  Other blood products are available for specialized needs, such as hemophilia or other clotting disorders. BEFORE THE TRANSFUSION  Who gives blood for transfusions?   Healthy volunteers who are fully evaluated to make sure their blood is safe. This is blood bank blood. Transfusion therapy is the safest it has ever been in the practice of medicine. Before blood is taken from a donor, a complete history is taken to make sure that person has no history of diseases nor engages in risky social behavior (examples are intravenous drug use or sexual activity with multiple partners). The donor's travel history is screened  to minimize risk of transmitting infections, such as malaria. The donated blood is tested for signs of infectious diseases, such as HIV and hepatitis. The blood is then tested to be sure it is compatible with you in order to minimize the chance of a transfusion reaction. If you or a relative donates blood, this is often done in anticipation of surgery and is not appropriate for emergency situations. It takes many days to process the donated blood. RISKS AND COMPLICATIONS Although transfusion therapy is very safe and saves many lives, the main dangers of transfusion include:   Getting an infectious disease.  Developing a transfusion reaction. This is an allergic reaction to something in the blood you were given. Every precaution is taken to prevent this. The decision to have a blood transfusion has been considered carefully by your caregiver before blood is given. Blood is not given unless the benefits outweigh the risks. AFTER THE TRANSFUSION  Right after receiving a blood transfusion, you will usually feel much better and more energetic. This is especially true if your red blood  cells have gotten low (anemic). The transfusion raises the level of the red blood cells which carry oxygen, and this usually causes an energy increase.  The nurse administering the transfusion will monitor you carefully for complications. HOME CARE INSTRUCTIONS  No special instructions are needed after a transfusion. You may find your energy is better. Speak with your caregiver about any limitations on activity for underlying diseases you may have. SEEK MEDICAL CARE IF:   Your condition is not improving after your transfusion.  You develop redness or irritation at the intravenous (IV) site. SEEK IMMEDIATE MEDICAL CARE IF:  Any of the following symptoms occur over the next 12 hours:  Shaking chills.  You have a temperature by mouth above 102 F (38.9 C), not controlled by medicine.  Chest, back, or muscle pain.  People around you feel you are not acting correctly or are confused.  Shortness of breath or difficulty breathing.  Dizziness and fainting.  You get a rash or develop hives.  You have a decrease in urine output.  Your urine turns a dark color or changes to pink, red, or brown. Any of the following symptoms occur over the next 10 days:  You have a temperature by mouth above 102 F (38.9 C), not controlled by medicine.  Shortness of breath.  Weakness after normal activity.  The white part of the eye turns yellow (jaundice).  You have a decrease in the amount of urine or are urinating less often.  Your urine turns a dark color or changes to pink, red, or brown. Document Released: 12/29/1999 Document Revised: 03/25/2011 Document Reviewed: 08/17/2007 [facility] Patient Information 2014 [place], [place].  _______________________________________________________________________

## 2017-05-06 ENCOUNTER — Encounter (HOSPITAL_COMMUNITY): Payer: Self-pay

## 2017-05-06 ENCOUNTER — Encounter (HOSPITAL_COMMUNITY)
Admission: RE | Admit: 2017-05-06 | Discharge: 2017-05-06 | Disposition: A | Discharge status: Home / Self Care | Payer: BLUE CROSS/BLUE SHIELD | Source: Ambulatory Visit | Attending: General Surgery | Admitting: General Surgery

## 2017-05-06 ENCOUNTER — Other Ambulatory Visit: Payer: Self-pay

## 2017-05-06 DIAGNOSIS — G4733 Obstructive sleep apnea (adult) (pediatric): Secondary | ICD-10-CM | POA: Insufficient documentation

## 2017-05-06 DIAGNOSIS — Z6841 Body Mass Index (BMI) 40.0 and over, adult: Secondary | ICD-10-CM | POA: Insufficient documentation

## 2017-05-06 DIAGNOSIS — Z01812 Encounter for preprocedural laboratory examination: Secondary | ICD-10-CM | POA: Diagnosis not present

## 2017-05-06 LAB — COMPREHENSIVE METABOLIC PANEL
ALT: 22 U/L (ref 14–54)
ANION GAP: 7 (ref 5–15)
AST: 18 U/L (ref 15–41)
Albumin: 3.6 g/dL (ref 3.5–5.0)
Alkaline Phosphatase: 73 U/L (ref 38–126)
BUN: 13 mg/dL (ref 6–20)
CHLORIDE: 109 mmol/L (ref 101–111)
CO2: 22 mmol/L (ref 22–32)
Calcium: 8.9 mg/dL (ref 8.9–10.3)
Creatinine, Ser: 0.65 mg/dL (ref 0.44–1.00)
GFR calc non Af Amer: >60 mL/min (ref >60)
Glucose, Bld: 138 mg/dL — ABNORMAL HIGH (ref 65–99)
Potassium: 4.1 mmol/L (ref 3.5–5.1)
SODIUM: 138 mmol/L (ref 135–145)
Total Bilirubin: 0.7 mg/dL (ref 0.3–1.2)
Total Protein: 7.4 g/dL (ref 6.5–8.1)

## 2017-05-06 LAB — CBC WITH DIFFERENTIAL/PLATELET
Basophils Absolute: 0 10*3/uL (ref 0.0–0.1)
Basophils Relative: 0 %
Eosinophils Absolute: 0.1 10*3/uL (ref 0.0–0.7)
Eosinophils Relative: 1 %
HEMATOCRIT: 40.1 % (ref 36.0–46.0)
HEMOGLOBIN: 13.2 g/dL (ref 12.0–15.0)
LYMPHS ABS: 1.9 10*3/uL (ref 0.7–4.0)
LYMPHS PCT: 21 %
MCH: 29.1 pg (ref 26.0–34.0)
MCHC: 32.9 g/dL (ref 30.0–36.0)
MCV: 88.3 fL (ref 78.0–100.0)
MONOS PCT: 6 %
Monocytes Absolute: 0.5 10*3/uL (ref 0.1–1.0)
NEUTROS ABS: 6.3 10*3/uL (ref 1.7–7.7)
NEUTROS PCT: 72 %
Platelets: 294 10*3/uL (ref 150–400)
RBC: 4.54 MIL/uL (ref 3.87–5.11)
RDW: 12.7 % (ref 11.5–15.5)
WBC: 8.8 10*3/uL (ref 4.0–10.5)

## 2017-05-06 LAB — ABO/RH: ABO/RH(D): O POS

## 2017-05-06 LAB — HEMOGLOBIN A1C
Hgb A1c MFr Bld: 6.1 % — ABNORMAL HIGH (ref 4.8–5.6)
MEAN PLASMA GLUCOSE: 128.37 mg/dL

## 2017-05-13 ENCOUNTER — Encounter (HOSPITAL_COMMUNITY): Payer: Self-pay

## 2017-05-13 ENCOUNTER — Inpatient Hospital Stay (HOSPITAL_COMMUNITY): Payer: BLUE CROSS/BLUE SHIELD | Admitting: Anesthesiology

## 2017-05-13 ENCOUNTER — Encounter (HOSPITAL_COMMUNITY)
Admission: RE | Disposition: A | Discharge status: Home / Self Care | Payer: Self-pay | Source: Ambulatory Visit | Attending: General Surgery

## 2017-05-13 ENCOUNTER — Other Ambulatory Visit: Payer: Self-pay

## 2017-05-13 ENCOUNTER — Inpatient Hospital Stay (HOSPITAL_COMMUNITY)
Admission: RE | Admit: 2017-05-13 | Discharge: 2017-05-14 | DRG: 621 | Disposition: A | Discharge status: Home / Self Care | Payer: BLUE CROSS/BLUE SHIELD | Source: Ambulatory Visit | Attending: General Surgery | Admitting: General Surgery

## 2017-05-13 DIAGNOSIS — Z87891 Personal history of nicotine dependence: Secondary | ICD-10-CM

## 2017-05-13 DIAGNOSIS — E559 Vitamin D deficiency, unspecified: Secondary | ICD-10-CM | POA: Diagnosis present

## 2017-05-13 DIAGNOSIS — R739 Hyperglycemia, unspecified: Secondary | ICD-10-CM | POA: Diagnosis present

## 2017-05-13 DIAGNOSIS — G40909 Epilepsy, unspecified, not intractable, without status epilepticus: Secondary | ICD-10-CM | POA: Diagnosis present

## 2017-05-13 DIAGNOSIS — K449 Diaphragmatic hernia without obstruction or gangrene: Secondary | ICD-10-CM | POA: Diagnosis present

## 2017-05-13 DIAGNOSIS — G4733 Obstructive sleep apnea (adult) (pediatric): Secondary | ICD-10-CM | POA: Diagnosis present

## 2017-05-13 DIAGNOSIS — Z9884 Bariatric surgery status: Secondary | ICD-10-CM

## 2017-05-13 DIAGNOSIS — Z6841 Body Mass Index (BMI) 40.0 and over, adult: Secondary | ICD-10-CM | POA: Diagnosis not present

## 2017-05-13 DIAGNOSIS — J45909 Unspecified asthma, uncomplicated: Secondary | ICD-10-CM | POA: Diagnosis present

## 2017-05-13 DIAGNOSIS — Z9989 Dependence on other enabling machines and devices: Secondary | ICD-10-CM

## 2017-05-13 HISTORY — PX: LAPAROSCOPIC GASTRIC SLEEVE RESECTION: SHX5895

## 2017-05-13 LAB — HEMOGLOBIN AND HEMATOCRIT, BLOOD
HEMATOCRIT: 38.7 % (ref 36.0–46.0)
HEMOGLOBIN: 12.8 g/dL (ref 12.0–15.0)

## 2017-05-13 LAB — TYPE AND SCREEN
ABO/RH(D): O POS
ANTIBODY SCREEN: NEGATIVE

## 2017-05-13 LAB — PREGNANCY, URINE: PREG TEST UR: NEGATIVE

## 2017-05-13 SURGERY — GASTRECTOMY, SLEEVE, LAPAROSCOPIC
Anesthesia: General | Site: Abdomen

## 2017-05-13 MED ORDER — FENTANYL CITRATE (PF) 100 MCG/2ML IJ SOLN
INTRAMUSCULAR | Status: DC | PRN
  Administered 2017-05-13: 100 ug via INTRAVENOUS

## 2017-05-13 MED ORDER — KCL IN DEXTROSE-NACL 20-5-0.45 MEQ/L-%-% IV SOLN
INTRAVENOUS | Status: DC
  Administered 2017-05-13 – 2017-05-14 (×2): via INTRAVENOUS
  Filled 2017-05-13: qty 1000

## 2017-05-13 MED ORDER — HEPARIN SODIUM (PORCINE) 5000 UNIT/ML IJ SOLN
5000.0000 [IU] | INTRAMUSCULAR | Status: AC
  Administered 2017-05-13: 5000 [IU] via SUBCUTANEOUS
  Filled 2017-05-13: qty 1

## 2017-05-13 MED ORDER — ONDANSETRON HCL 4 MG/2ML IJ SOLN
INTRAMUSCULAR | Status: AC
  Filled 2017-05-13: qty 2

## 2017-05-13 MED ORDER — LACTATED RINGERS IV SOLN
INTRAVENOUS | Status: DC
  Administered 2017-05-13 (×2): via INTRAVENOUS

## 2017-05-13 MED ORDER — OXYCODONE HCL 5 MG/5ML PO SOLN
5.0000 mg | ORAL | Status: DC | PRN
  Administered 2017-05-13: 10 mg via ORAL
  Filled 2017-05-13: qty 10

## 2017-05-13 MED ORDER — DEXAMETHASONE SODIUM PHOSPHATE 10 MG/ML IJ SOLN
INTRAMUSCULAR | Status: DC | PRN
  Administered 2017-05-13: 10 mg via INTRAVENOUS

## 2017-05-13 MED ORDER — SUGAMMADEX SODIUM 500 MG/5ML IV SOLN
INTRAVENOUS | Status: AC
  Filled 2017-05-13: qty 5

## 2017-05-13 MED ORDER — CHLORHEXIDINE GLUCONATE 4 % EX LIQD: 60.0000 mL | Freq: Once | CUTANEOUS | Status: DC

## 2017-05-13 MED ORDER — DIPHENHYDRAMINE HCL 50 MG/ML IJ SOLN: 12.5000 mg | Freq: Three times a day (TID) | INTRAMUSCULAR | Status: DC | PRN

## 2017-05-13 MED ORDER — ACETAMINOPHEN 160 MG/5ML PO SOLN
650.0000 mg | Freq: Four times a day (QID) | ORAL | Status: DC
  Administered 2017-05-14 (×2): 650 mg via ORAL
  Filled 2017-05-13 (×2): qty 20.3

## 2017-05-13 MED ORDER — 0.9 % SODIUM CHLORIDE (POUR BTL) OPTIME
TOPICAL | Status: DC | PRN
  Administered 2017-05-13: 1000 mL

## 2017-05-13 MED ORDER — HYDROMORPHONE HCL 1 MG/ML IJ SOLN: 0.2500 mg | INTRAMUSCULAR | Status: DC | PRN

## 2017-05-13 MED ORDER — ONDANSETRON HCL 4 MG/2ML IJ SOLN
INTRAMUSCULAR | Status: DC | PRN
  Administered 2017-05-13: 4 mg via INTRAVENOUS

## 2017-05-13 MED ORDER — FAMOTIDINE IN NACL 20-0.9 MG/50ML-% IV SOLN
20.0000 mg | Freq: Two times a day (BID) | INTRAVENOUS | Status: DC
  Administered 2017-05-13: 20 mg via INTRAVENOUS
  Filled 2017-05-13 (×2): qty 50

## 2017-05-13 MED ORDER — LIDOCAINE HCL (CARDIAC) PF 100 MG/5ML IV SOSY
PREFILLED_SYRINGE | INTRAVENOUS | Status: DC | PRN
  Administered 2017-05-13: 80 mg via INTRAVENOUS

## 2017-05-13 MED ORDER — LIDOCAINE 20MG/ML (2%) 15 ML SYRINGE OPTIME
INTRAMUSCULAR | Status: DC | PRN
  Administered 2017-05-13: 1.5 mg/kg/h via INTRAVENOUS

## 2017-05-13 MED ORDER — LACTATED RINGERS IR SOLN
Status: DC | PRN
  Administered 2017-05-13: 1000 mL

## 2017-05-13 MED ORDER — PROMETHAZINE HCL 25 MG/ML IJ SOLN: 12.5000 mg | Freq: Four times a day (QID) | INTRAMUSCULAR | Status: DC | PRN

## 2017-05-13 MED ORDER — HYDROMORPHONE HCL 1 MG/ML IJ SOLN
INTRAMUSCULAR | Status: DC
Start: 2017-05-13 — End: 2017-05-13
  Filled 2017-05-13: qty 1

## 2017-05-13 MED ORDER — ROCURONIUM BROMIDE 100 MG/10ML IV SOLN
INTRAVENOUS | Status: DC | PRN
  Administered 2017-05-13: 60 mg via INTRAVENOUS
  Administered 2017-05-13: 10 mg via INTRAVENOUS
  Administered 2017-05-13: 20 mg via INTRAVENOUS

## 2017-05-13 MED ORDER — ONDANSETRON HCL 4 MG/2ML IJ SOLN
4.0000 mg | Freq: Four times a day (QID) | INTRAMUSCULAR | Status: DC | PRN
  Administered 2017-05-13 – 2017-05-14 (×2): 4 mg via INTRAVENOUS
  Filled 2017-05-13 (×2): qty 2

## 2017-05-13 MED ORDER — PROPOFOL 10 MG/ML IV BOLUS
INTRAVENOUS | Status: AC
  Filled 2017-05-13: qty 40

## 2017-05-13 MED ORDER — LIDOCAINE HCL 2 % IJ SOLN
INTRAMUSCULAR | Status: AC
Start: 2017-05-13 — End: 2017-05-13
  Filled 2017-05-13: qty 20

## 2017-05-13 MED ORDER — DEXAMETHASONE SODIUM PHOSPHATE 4 MG/ML IJ SOLN: 4.0000 mg | INTRAMUSCULAR | Status: DC

## 2017-05-13 MED ORDER — FENTANYL CITRATE (PF) 250 MCG/5ML IJ SOLN
INTRAMUSCULAR | Status: AC
  Filled 2017-05-13: qty 5

## 2017-05-13 MED ORDER — PREMIER PROTEIN SHAKE
2.0000 [oz_av] | ORAL | Status: DC
  Administered 2017-05-14 (×3): 2 [oz_av] via ORAL

## 2017-05-13 MED ORDER — SODIUM CHLORIDE 0.9 % IJ SOLN
INTRAMUSCULAR | Status: DC | PRN
  Administered 2017-05-13: 50 mL

## 2017-05-13 MED ORDER — MORPHINE SULFATE (PF) 2 MG/ML IV SOLN: 1.0000 mg | INTRAVENOUS | Status: DC | PRN

## 2017-05-13 MED ORDER — CEFOTETAN DISODIUM-DEXTROSE 2-2.08 GM-%(50ML) IV SOLR
2.0000 g | INTRAVENOUS | Status: AC
  Administered 2017-05-13: 2 g via INTRAVENOUS
  Filled 2017-05-13: qty 50

## 2017-05-13 MED ORDER — PROPOFOL 10 MG/ML IV BOLUS
INTRAVENOUS | Status: DC | PRN
  Administered 2017-05-13: 200 mg via INTRAVENOUS

## 2017-05-13 MED ORDER — BUPIVACAINE LIPOSOME 1.3 % IJ SUSP
20.0000 mL | Freq: Once | INTRAMUSCULAR | Status: AC
  Administered 2017-05-13: 20 mL
  Filled 2017-05-13: qty 20

## 2017-05-13 MED ORDER — GABAPENTIN 300 MG PO CAPS
300.0000 mg | ORAL_CAPSULE | ORAL | Status: AC
  Administered 2017-05-13: 300 mg via ORAL
  Filled 2017-05-13: qty 1

## 2017-05-13 MED ORDER — ENOXAPARIN SODIUM 30 MG/0.3ML ~~LOC~~ SOLN
30.0000 mg | Freq: Two times a day (BID) | SUBCUTANEOUS | Status: DC
  Administered 2017-05-14: 30 mg via SUBCUTANEOUS
  Filled 2017-05-13 (×2): qty 0.3

## 2017-05-13 MED ORDER — SUGAMMADEX SODIUM 500 MG/5ML IV SOLN
INTRAVENOUS | Status: DC | PRN
  Administered 2017-05-13: 300 mg via INTRAVENOUS

## 2017-05-13 MED ORDER — KETAMINE HCL 10 MG/ML IJ SOLN
INTRAMUSCULAR | Status: DC | PRN
  Administered 2017-05-13: 30 mg via INTRAVENOUS

## 2017-05-13 MED ORDER — SODIUM CHLORIDE 0.9 % IJ SOLN
INTRAMUSCULAR | Status: AC
  Filled 2017-05-13: qty 50

## 2017-05-13 MED ORDER — KETAMINE HCL 10 MG/ML IJ SOLN
INTRAMUSCULAR | Status: AC
  Filled 2017-05-13: qty 1

## 2017-05-13 MED ORDER — SCOPOLAMINE 1 MG/3DAYS TD PT72
1.0000 | MEDICATED_PATCH | TRANSDERMAL | Status: DC
  Administered 2017-05-13: 1.5 mg via TRANSDERMAL
  Filled 2017-05-13: qty 1

## 2017-05-13 MED ORDER — MIDAZOLAM HCL 5 MG/5ML IJ SOLN
INTRAMUSCULAR | Status: DC | PRN
  Administered 2017-05-13: 2 mg via INTRAVENOUS

## 2017-05-13 MED ORDER — APREPITANT 40 MG PO CAPS
40.0000 mg | ORAL_CAPSULE | ORAL | Status: AC
  Administered 2017-05-13: 40 mg via ORAL
  Filled 2017-05-13: qty 1

## 2017-05-13 MED ORDER — SIMETHICONE 80 MG PO CHEW: 80.0000 mg | CHEWABLE_TABLET | Freq: Four times a day (QID) | ORAL | Status: DC | PRN

## 2017-05-13 MED ORDER — STERILE WATER FOR IRRIGATION IR SOLN
Status: DC | PRN
Start: 2017-05-13 — End: 2017-05-13
  Administered 2017-05-13: 1000 mL

## 2017-05-13 MED ORDER — GABAPENTIN 100 MG PO CAPS
200.0000 mg | ORAL_CAPSULE | Freq: Two times a day (BID) | ORAL | Status: DC
  Administered 2017-05-13 – 2017-05-14 (×2): 200 mg via ORAL
  Filled 2017-05-13 (×2): qty 2

## 2017-05-13 MED ORDER — ACETAMINOPHEN 500 MG PO TABS
1000.0000 mg | ORAL_TABLET | ORAL | Status: AC
  Administered 2017-05-13: 1000 mg via ORAL
  Filled 2017-05-13: qty 2

## 2017-05-13 MED ORDER — MIDAZOLAM HCL 2 MG/2ML IJ SOLN
INTRAMUSCULAR | Status: AC
  Filled 2017-05-13: qty 2

## 2017-05-13 SURGICAL SUPPLY — 71 items
APPLIER CLIP ROT 10 11.4 M/L (STAPLE)
APPLIER CLIP ROT 13.4 12 LRG (CLIP)
BANDAGE ADH SHEER 1  50/CT (GAUZE/BANDAGES/DRESSINGS) ×18 IMPLANT
BENZOIN TINCTURE PRP APPL 2/3 (GAUZE/BANDAGES/DRESSINGS) ×3 IMPLANT
BLADE SURG SZ11 CARB STEEL (BLADE) ×3 IMPLANT
CABLE HIGH FREQUENCY MONO STRZ (ELECTRODE) ×3 IMPLANT
CHLORAPREP W/TINT 26ML (MISCELLANEOUS) ×6 IMPLANT
CLIP APPLIE ROT 10 11.4 M/L (STAPLE) IMPLANT
CLIP APPLIE ROT 13.4 12 LRG (CLIP) IMPLANT
CLOSURE WOUND 1/2 X4 (GAUZE/BANDAGES/DRESSINGS) ×1
COVER SURGICAL LIGHT HANDLE (MISCELLANEOUS) ×3 IMPLANT
DECANTER SPIKE VIAL GLASS SM (MISCELLANEOUS) ×3 IMPLANT
DEVICE SUT QUICK LOAD TK 5 (STAPLE) ×2 IMPLANT
DEVICE SUT TI-KNOT TK 5X26 (MISCELLANEOUS) ×2 IMPLANT
DEVICE SUTURE ENDOST 10MM (ENDOMECHANICALS) ×3 IMPLANT
DEVICE TI KNOT TK5 (MISCELLANEOUS) ×1
DISSECTOR BLUNT TIP ENDO 5MM (MISCELLANEOUS) IMPLANT
DRAPE UTILITY XL STRL (DRAPES) ×6 IMPLANT
ELECT L-HOOK LAP 45CM DISP (ELECTROSURGICAL)
ELECT PENCIL ROCKER SW 15FT (MISCELLANEOUS) IMPLANT
ELECT REM PT RETURN 15FT ADLT (MISCELLANEOUS) ×3 IMPLANT
ELECTRODE L-HOOK LAP 45CM DISP (ELECTROSURGICAL) IMPLANT
GAUZE SPONGE 2X2 8PLY STRL LF (GAUZE/BANDAGES/DRESSINGS) IMPLANT
GAUZE SPONGE 4X4 12PLY STRL (GAUZE/BANDAGES/DRESSINGS) IMPLANT
GLOVE BIO SURGEON STRL SZ7.5 (GLOVE) ×3 IMPLANT
GLOVE BIOGEL PI IND STRL 6.5 (GLOVE) ×3 IMPLANT
GLOVE BIOGEL PI IND STRL 7.0 (GLOVE) ×3 IMPLANT
GLOVE BIOGEL PI INDICATOR 6.5 (GLOVE) ×6
GLOVE BIOGEL PI INDICATOR 7.0 (GLOVE) ×6
GLOVE INDICATOR 8.0 STRL GRN (GLOVE) ×3 IMPLANT
GOWN STRL REUS W/TWL XL LVL3 (GOWN DISPOSABLE) ×12 IMPLANT
GRASPER SUT TROCAR 14GX15 (MISCELLANEOUS) ×3 IMPLANT
HOVERMATT SINGLE USE (MISCELLANEOUS) ×3 IMPLANT
KIT BASIN OR (CUSTOM PROCEDURE TRAY) ×3 IMPLANT
MARKER SKIN DUAL TIP RULER LAB (MISCELLANEOUS) ×3 IMPLANT
NEEDLE SPNL 22GX3.5 QUINCKE BK (NEEDLE) ×3 IMPLANT
PACK UNIVERSAL I (CUSTOM PROCEDURE TRAY) ×3 IMPLANT
QUICK LOAD TK 5 (STAPLE) ×1
RELOAD STAPLER 60MM BLK (STAPLE) IMPLANT
RELOAD STAPLER BLUE 60MM (STAPLE) ×2 IMPLANT
RELOAD STAPLER GOLD 60MM (STAPLE) ×1 IMPLANT
RELOAD STAPLER GREEN 60MM (STAPLE) ×2 IMPLANT
SCISSORS LAP 5X45 EPIX DISP (ENDOMECHANICALS) IMPLANT
SEALANT SURGICAL APPL DUAL CAN (MISCELLANEOUS) IMPLANT
SET IRRIG TUBING LAPAROSCOPIC (IRRIGATION / IRRIGATOR) ×3 IMPLANT
SHEARS HARMONIC ACE PLUS 45CM (MISCELLANEOUS) ×3 IMPLANT
SLEEVE GASTRECTOMY 40FR VISIGI (MISCELLANEOUS) ×3 IMPLANT
SLEEVE XCEL OPT CAN 5 100 (ENDOMECHANICALS) ×9 IMPLANT
SOLUTION ANTI FOG 6CC (MISCELLANEOUS) ×3 IMPLANT
SPONGE GAUZE 2X2 STER 10/PKG (GAUZE/BANDAGES/DRESSINGS)
SPONGE LAP 18X18 RF (DISPOSABLE) ×3 IMPLANT
STAPLER ECHELON BIOABSB 60 FLE (MISCELLANEOUS) ×15 IMPLANT
STAPLER ECHELON LONG 60 440 (INSTRUMENTS) ×3 IMPLANT
STAPLER RELOAD 60MM BLK (STAPLE)
STAPLER RELOAD BLUE 60MM (STAPLE) ×6
STAPLER RELOAD GOLD 60MM (STAPLE) ×3
STAPLER RELOAD GREEN 60MM (STAPLE) ×6
STRIP CLOSURE SKIN 1/2X4 (GAUZE/BANDAGES/DRESSINGS) ×2 IMPLANT
SUT MNCRL AB 4-0 PS2 18 (SUTURE) ×3 IMPLANT
SUT SURGIDAC NAB ES-9 0 48 120 (SUTURE) ×3 IMPLANT
SUT VICRYL 0 TIES 12 18 (SUTURE) ×3 IMPLANT
SYR 20CC LL (SYRINGE) ×3 IMPLANT
SYR 50ML LL SCALE MARK (SYRINGE) ×3 IMPLANT
TOWEL OR 17X26 10 PK STRL BLUE (TOWEL DISPOSABLE) ×3 IMPLANT
TOWEL OR NON WOVEN STRL DISP B (DISPOSABLE) ×3 IMPLANT
TROCAR BLADELESS 15MM (ENDOMECHANICALS) ×3 IMPLANT
TROCAR BLADELESS OPT 5 100 (ENDOMECHANICALS) ×3 IMPLANT
TUBING CONNECTING 10 (TUBING) ×4 IMPLANT
TUBING CONNECTING 10' (TUBING) ×2
TUBING ENDO SMARTCAP (MISCELLANEOUS) ×3 IMPLANT
TUBING INSUF HEATED (TUBING) ×3 IMPLANT

## 2017-05-13 NOTE — Discharge Instructions (Signed)
° ° ° °GASTRIC BYPASS/SLEEVE ° Home Care Instructions ° ° These instructions are to help you care for yourself when you go home. ° °Call: If you have any problems. °• Call [phone] and ask for the surgeon on call °• If you need immediate help, come to the ER at [facility].  °• Tell the ER staff that you are a new post-op gastric bypass or gastric sleeve patient °  °Signs and symptoms to report: • Severe vomiting or nausea °o If you cannot keep down clear liquids for longer than 1 day, call your surgeon  °• Abdominal pain that does not get better after taking your pain medication °• Fever over 100.4° F with chills °• Heart beating over 100 beats a minute °• Shortness of breath at rest °• Chest pain °•  Redness, swelling, drainage, or foul odor at incision (surgical) sites °•  If your incisions open or pull apart °• Swelling or pain in calf (lower leg) °• Diarrhea (Loose bowel movements that happen often), frequent watery, uncontrolled bowel movements °• Constipation, (no bowel movements for 3 days) if this happens: Pick one °o Milk of Magnesia, 2 tablespoons by mouth, 3 times a day for 2 days if needed °o Stop taking Milk of Magnesia once you have a bowel movement °o Call your doctor if constipation continues °Or °o Miralax  (instead of Milk of Magnesia) following the label instructions °o Stop taking Miralax once you have a bowel movement °o Call your doctor if constipation continues °• Anything you think is not normal °  °Normal side effects after surgery: • Unable to sleep at night or unable to focus °• Irritability or moody °• Being tearful (crying) or depressed °These are common complaints, possibly related to your anesthesia medications that put you to sleep, stress of surgery, and change in lifestyle.  This usually goes away a few weeks after surgery.  If these feelings continue, call your primary care doctor. °  °Wound Care: You may have surgical glue, steri-strips, or staples over your incisions after  surgery °• Surgical glue:  Looks like a clear film over your incisions and will wear off a little at a time °• Steri-strips: Strips of tape over your incisions. You may notice a yellowish color on the skin under the steri-strips. This is used to make the   steri-strips stick better. Do not pull the steri-strips off - let them fall off °• Staples: Staples may be removed before you leave the hospital °o If you go home with staples, call [facility], [phone] at for an appointment with your surgeon’s nurse to have staples removed 10 days after surgery. °• Showering: You may shower two (2) days after your surgery unless your surgeon tells you differently °o Wash gently around incisions with warm soapy water, rinse well, and gently pat dry  °o No tub baths until staples are removed, steri-strips fall off or glue is gone.  °  °Medications: • Medications should be liquid or crushed if larger than the size of a dime °• Extended release pills (medication that release a little bit at a time through the day) should NOT be crushed or cut. (examples include XL, ER, DR, SR) °• Depending on the size and number of medications you take, you may need to space (take a few throughout the day)/change the time you take your medications so that you do not over-fill your pouch (smaller stomach) °• Make sure you follow-up with your primary care doctor to   make medication changes needed during rapid weight loss and life-style changes °• If you have diabetes, follow up with the doctor that orders your diabetes medication(s) within one week after surgery and check your blood sugar regularly. °• Do not drive while taking prescription pain medication  °• It is ok to take Tylenol by the bottle instructions with your pain medicine or instead of your pain medicine as needed.  DO NOT TAKE NSAIDS (EXAMPLES OF NSAIDS:  IBUPROFREN/ NAPROXEN)  °Diet:                    First 2 Weeks ° You will see the dietician t about two (2) weeks  after your surgery. The dietician will increase the types of foods you can eat if you are handling liquids well: °• If you have severe vomiting or nausea and cannot keep down clear liquids lasting longer than 1 day, call your surgeon @ ([phone]) °Protein Shake °• Drink at least 2 ounces of shake 5-6 times per day °• Each serving of protein shakes (usually 8 - 12 ounces) should have: °o 15 grams of protein  °o And no more than 5 grams of carbohydrate  °• Goal for protein each day: °o Men = 80 grams per day °o Women = 60 grams per day °• Protein powder may be added to fluids such as non-fat milk or Lactaid milk or unsweetened Soy/Almond milk (limit to 35 grams added protein powder per serving) ° °Hydration °• Slowly increase the amount of water and other clear liquids as tolerated (See Acceptable Fluids) °• Slowly increase the amount of protein shake as tolerated  °•  Sip fluids slowly and throughout the day.  Do not use straws. °• May use sugar substitutes in small amounts (no more than 6 - 8 packets per day; i.e. Splenda) ° °Fluid Goal °• The first goal is to drink at least 8 ounces of protein shake/drink per day (or as directed by the nutritionist); some examples of protein shakes are Syntrax Nectar, Adkins Advantage, EAS Edge HP, and Unjury. See handout from pre-op Bariatric Education Class: °o Slowly increase the amount of protein shake you drink as tolerated °o You may find it easier to slowly sip shakes throughout the day °o It is important to get your proteins in first °• Your fluid goal is to drink 64 - 100 ounces of fluid daily °o It may take a few weeks to build up to this °• 32 oz (or more) should be clear liquids  °And  °• 32 oz (or more) should be full liquids (see below for examples) °• Liquids should not contain sugar, caffeine, or carbonation ° °Clear Liquids: °• Water or Sugar-free flavored water (i.e. Fruit H2O, Propel) °• Decaffeinated coffee or tea (sugar-free) °• Louellen Lite, Wyler’s Lite,  Minute Maid Lite °• Sugar-free Jell-O °• Bouillon or broth °• Sugar-free Popsicle:   *Less than 20 calories each; Limit 1 per day ° °Full Liquids: °Protein Shakes/Drinks + 2 choices per day of other full liquids °• Full liquids must be: °o No More Than 15 grams of Carbs per serving  °o No More Than 3 grams of Fat per serving °• Strained low-fat cream soup (except Cream of Potato or Tomato) °• Non-Fat milk °• Fat-free Lactaid Milk °• Unsweetened Soy Or Unsweetened Almond Milk °• Low Sugar yogurt (Dannon Lite & Fit, Greek yogurt; Oikos Triple Zero; Chobani Simply 100; Yoplait 100 calorie Greek - No Fruit on the Bottom) ° °  °Vitamins   and Minerals • Start 1 day after surgery unless otherwise directed by your surgeon °• 2 Chewable Bariatric Specific Multivitamin / Multimineral Supplement with iron (Example: Bariatric Advantage Multi EA) °• Chewable Calcium with Vitamin D-3 °(Example: 3 Chewable Calcium Plus 600 with Vitamin D-3) °o Take 500 mg three (3) times a day for a total of 1500 mg each day °o Do not take all 3 doses of calcium at one time as it may cause constipation, and you can only absorb 500 mg  at a time  °o Do not mix multivitamins containing iron with calcium supplements; take 2 hours apart °• Menstruating women and those with a history of anemia (a blood disease that causes weakness) may need extra iron °o Talk with your doctor to see if you need more iron °• Do not stop taking or change any vitamins or minerals until you talk to your dietitian or surgeon °• Your Dietitian and/or surgeon must approve all vitamin and mineral supplements °  °Activity and Exercise: Limit your physical activity as instructed by your doctor.  It is important to continue walking at home.  During this time, use these guidelines: °• Do not lift anything greater than ten (10) pounds for at least two (2) weeks °• Do not go back to work or drive until your surgeon says you can °• You may have sex when you feel comfortable  °o It is  VERY important for female patients to use a reliable birth control method; fertility often increases after surgery  °o All hormonal birth control will be ineffective for 30 days after surgery due to medications given during surgery a barrier method must be used. °o Do not get pregnant for at least 18 months °• Start exercising as soon as your doctor tells you that you can °o Make sure your doctor approves any physical activity °• Start with a simple walking program °• Walk 5-15 minutes each day, 7 days per week.  °• Slowly increase until you are walking 30-45 minutes per day °Consider joining our BELT program. [phone] or email [email or web address] °  °Special Instructions Things to remember: °• Use your CPAP when sleeping if this applies to you ° °• [facility] has two free Bariatric Surgery Support Groups that meet monthly °o The 3rd Thursday of each month, 6 pm, [facility]  °o The 2nd Friday of each month, 11:45 am in the private dining room in the basement of [facility] °• It is very important to keep all follow up appointments with your surgeon, dietitian, primary care physician, and behavioral health practitioner °• Routine follow up schedule with your surgeon include appointments at 2-3 weeks, 6-8 weeks, 6 months, and 1 year at a minimum.  Your surgeon may request to see you more often.   °o After the first year, please follow up with your bariatric surgeon and dietitian at least once a year in order to maintain best weight loss results °[facility]: [phone] °[facility]: [phone] °Bariatric Nurse Coordinator: [phone] °  °   Reviewed and Endorsed  °by [facility] Patient Education Committee, June, 2016 °Edits Approved: Aug, 2018 ° ° ° °

## 2017-05-13 NOTE — Op Note (Signed)
Preoperative diagnosis: laparoscopic sleeve gastrectomy  Postoperative diagnosis: Same   Procedure: Upper endoscopy   Surgeon: Berna Bue, M.D.  Anesthesia: Gen.   Description of procedure: The endoscopy was placed in the mouth and into the oropharynx and under endoscopic vision it was advanced to the esophagogastric junction.  The pouch was insufflated and no bleeding or bubbles were seen.  The GEJ was identified at 41cm from the teeth. The lumen was free of any angulation or undue narrowing at the incisura. No bleeding or leaks were detected. The scope was withdrawn without difficulty.    Berna Bue, M.D. General, Bariatric, & Minimally Invasive Surgery [facility]

## 2017-05-13 NOTE — Interval H&P Note (Signed)
History and Physical Interval Note:  05/13/2017 1:47 PM  Donna Mcintyre  has presented today for surgery, with the diagnosis of Morbid Obesity, I10, OSA  The various methods of treatment have been discussed with the patient and family. After consideration of risks, benefits and other options for treatment, the patient has consented to  Procedure(s): LAPAROSCOPIC GASTRIC SLEEVE RESECTION, UPPER ENDO (N/A) as a surgical intervention .  The patient's history has been reviewed, patient examined, no change in status, stable for surgery.  I have reviewed the patient's chart and labs.  Questions were answered to the patient's satisfaction.    Possible hiatal hernia repair  Mary Sella. Andrey Campanile, MD, FACS General, Bariatric, & Minimally Invasive Surgery [facility]  Gaynelle Adu

## 2017-05-13 NOTE — Op Note (Signed)
05/13/2017 Donna Mcintyre [birth year] [id number]   PRE-OPERATIVE DIAGNOSIS:     Severe obesity BMI 49   OSA on CPAP   Hiatal hernia   Morbid obesity (HCC)  POST-OPERATIVE DIAGNOSIS:  same  PROCEDURE:  Procedure(s): LAPAROSCOPIC SLEEVE GASTRECTOMY WITH HIATAL HERNIA REPAIR UPPER GI ENDOSCOPY  SURGEON:  Surgeon(s): Atilano Ina, MD FACS FASMBS  ASSISTANTS: Phylliss Blakes MD   ANESTHESIA:   general  DRAINS: none   BOUGIE: 40 fr ViSiGi  LOCAL MEDICATIONS USED:   Exparel  EBL: 20 cc  SPECIMEN:  Source of Specimen:  Greater curvature of stomach  DISPOSITION OF SPECIMEN:  PATHOLOGY  COUNTS:  YES  INDICATION FOR PROCEDURE: This is a very pleasant 37 y.o.-year-old morbidly obese female who has had unsuccessful attempts for sustained weight loss. The patient presents today for a planned laparoscopic sleeve gastrectomy with upper endoscopy. We have discussed the risk and benefits of the procedure extensively preoperatively. Please see my separate notes.  PROCEDURE: After obtaining informed consent and receiving 5000 units of subcutaneous heparin, the patient was brought to the operating room at [facility] and placed supine on the operating room table. General endotracheal anesthesia was established. Sequential compression devices were placed. A orogastric tube was placed. The patient's abdomen was prepped and draped in the usual standard surgical fashion. The patient received preoperative IV antibiotic. A surgical timeout was performed. ERAS protocol used.   Access to the abdomen was achieved using a 5 mm 0 laparoscope thru a 5 mm trocar In the left upper Quadrant 2 fingerbreadths below the left subcostal margin using the Optiview technique. Pneumoperitoneum was smoothly established up to 15 mm of mercury. The laparoscope was advanced and the abdominal cavity was surveilled. The patient was then placed in reverse Trendelenburg.   A 5 mm trocar was placed slightly above and  to the left of the umbilicus under direct visualization.  The [facility] liver retractor was placed under the left lobe of the liver through a 5 mm trocar incision site in the subxiphoid position. A 5 mm trocar was placed in the lateral right upper quadrant along with a 15 mm trocar in the mid right abdomen. A final 5 mm trocar was placed in the lateral LUQ.  All under direct visualization after exparel had been infiltrated in bilateral lateral upper abdominal walls as a TAP block.  The stomach was inspected. It was completely decompressed and the orogastric tube was removed.  There was a small anterior dimple that was obviously visible. The calibration tube was placed in the oropharynx and guided down into the stomach by the CRNA. 10 mL of air was insufflated into the calibration balloon. The calibration tubing was then gently pulled back by the CRNA and it slid past the GE junction. At this point the calibration tubing was desufflated and pulled back into the esophagus. This confirmed my suspicion of a clinically significant hiatal hernia. The gastrohepatic ligament was incised with harmonic scalpel. The right crus was identified. We identified the crossing fat along the right crus. The adipose tissue just above this area was incised with harmonic scalpel. I then bluntly dissected out this area and identified the left crus. There was evidence of a hiatal hernia. I then mobilized the esophagus. The left and right crus were further mobilized with blunt dissection. I was then able to reapproximate the left and right crus with 0 Ethibond using an Endostitch suture device and securing it with a titanium tyknot. We then had the CRNA readvanced the  calibration tubing back into the stomach. 10 mL of air was insufflated into the calibration tube balloon. The calibration tube was then gently pulled back and there was resistance at the GE junction. The tube did not slide back up into the esophagus. At this point the  calibration tubing was deflated and removed from the patient's body.   We identified the pylorus and measured 6 cm proximal to the pylorus and identified an area of where we would start taking down the short gastric vessels. Harmonic scalpel was used to take down the short gastric vessels along the greater curvature of the stomach. We were able to enter the lesser sac. We continued to march along the greater curvature of the stomach taking down the short gastrics. As we approached the gastrosplenic ligament we took care in this area not to injure the spleen. We were able to take down the entire gastrosplenic ligament. We then mobilized the fundus away from the left crus of diaphragm. There were not any significant posterior gastric avascular attachments. This left the stomach completely mobilized. No vessels had been taken down along the lesser curvature of the stomach.  We then reidentified the pylorus. A 40Fr ViSiGi was then placed in the oropharynx and advanced down into the stomach and placed in the distal antrum and positioned along the lesser curvature. It was placed under suction which secured the 40Fr ViSiGi in place along the lesser curve. Then using the Ethicon echelon 60 mm stapler with a green load with Seamguard, I placed a stapler along the antrum approximately 5 cm from the pylorus. The stapler was angled so that there is ample room at the angularis incisura. I then fired the first staple load after inspecting it posteriorly to ensure adequate space both anteriorly and posteriorly. At this point I still was not completely past the angularis so with another green load with Seamguard, I placed the stapler in position just inside the prior stapleline. We then rotated the stomach to insure that there was adequate anteriorly as well as posteriorly. The stapler was then fired.  At this point I started using 60 mm gold load staple cartridge x1 with Seamguard. The echelon stapler was then repositioned  with a 60 mm blue load with Seamguard and we continued to march up along the ViSiGi. My assistant was holding traction along the greater curvature stomach along the cauterized short gastric vessels ensuring that the stomach was symmetrically retracted. Prior to each firing of the staple, we rotated the stomach to ensure that there is adequate stomach left.  As we approached the fundus, I used 60 mm blue cartridge with Seamguard aiming  lateral to the GE junction after mobilizing some of the esophageal fat pad.  The sleeve was inspected. There is no evidence of cork screw. The staple line appeared hemostatic. The CRNA inflated the ViSiGi to the green zone and the upper abdomen was flooded with saline. There were no bubbles. The sleeve was decompressed and the ViSiGi removed. My assistant scrubbed out and performed an upper endoscopy. The sleeve easily distended with air and the scope was easily advanced to the pylorus. There is no evidence of internal bleeding or cork screwing. There was no narrowing at the angularis. There is no evidence of bubbles. Please see his operative note for further details. The gastric sleeve was decompressed and the endoscope was removed.  The greater curvature the stomach was grasped with a laparoscopic grasper and removed from the 15 mm trocar site.  The liver  retractor was removed. I then closed the 15 mm trocar site with 1 interrupted 0 Vicryl sutures through the fascia using the endoclose. The closure was viewed laparoscopically and it was airtight. Remaining Exparel was then infiltrated in the preperitoneal spaces around the trocar sites. Pneumoperitoneum was released. All trocar sites were closed with a 4-0 Monocryl in a subcuticular fashion followed by the application of benzoin, steri-strips, and bandaids. The patient was extubated and taken to the recovery room in stable condition. All needle, instrument, and sponge counts were correct x2. There are no immediate  complications  (2) 60 mm green with Seamguard (1) 60 mm gold with seamguard (2) 60 mm blue with seamguard  PLAN OF CARE: Admit to inpatient   PATIENT DISPOSITION:  PACU - hemodynamically stable.   Delay start of Pharmacological VTE agent (>24hrs) due to surgical blood loss or risk of bleeding:  no  Mary Sella. Andrey Campanile, MD, FACS FASMBS General, Bariatric, & Minimally Invasive Surgery [facility], [place]

## 2017-05-13 NOTE — Anesthesia Preprocedure Evaluation (Addendum)
Anesthesia Evaluation  Patient identified by MRN, date of birth, ID band Patient awake    Reviewed: Allergy & Precautions, H&P , NPO status , Patient's Chart, lab work & pertinent test results  Airway Mallampati: I  TM Distance: >3 FB Neck ROM: Full    Dental no notable dental hx. (+) Teeth Intact, Dental Advisory Given   Pulmonary asthma , sleep apnea and Continuous Positive Airway Pressure Ventilation , former smoker,    Pulmonary exam normal breath sounds clear to auscultation       Cardiovascular negative cardio ROS   Rhythm:Regular Rate:Normal     Neuro/Psych  Headaches, Anxiety negative psych ROS   GI/Hepatic negative GI ROS, Neg liver ROS,   Endo/Other  Morbid obesity  Renal/GU negative Renal ROS  negative genitourinary   Musculoskeletal   Abdominal   Peds  Hematology negative hematology ROS (+)   Anesthesia Other Findings   Reproductive/Obstetrics negative OB ROS                            Anesthesia Physical Anesthesia Plan  ASA: III  Anesthesia Plan: General   Post-op Pain Management:    Induction: Intravenous  PONV Risk Score and Plan: 4 or greater and Ondansetron, Dexamethasone, Midazolam and Scopolamine patch - Pre-op  Airway Management Planned: Oral ETT  Additional Equipment:   Intra-op Plan:   Post-operative Plan: Extubation in OR  Informed Consent: I have reviewed the patients History and Physical, chart, labs and discussed the procedure including the risks, benefits and alternatives for the proposed anesthesia with the patient or authorized representative who has indicated his/her understanding and acceptance.   Dental advisory given  Plan Discussed with: CRNA  Anesthesia Plan Comments:         Anesthesia Quick Evaluation

## 2017-05-13 NOTE — Anesthesia Procedure Notes (Signed)
Procedure Name: Intubation Date/Time: 05/13/2017 2:02 PM Performed by: Thornell Mule, CRNA Pre-anesthesia Checklist: Patient identified, Emergency Drugs available, Suction available and Patient being monitored Patient Re-evaluated:Patient Re-evaluated prior to induction Oxygen Delivery Method: Circle system utilized Preoxygenation: Pre-oxygenation with 100% oxygen Induction Type: IV induction Ventilation: Mask ventilation without difficulty Laryngoscope Size: Miller and 3 Grade View: Grade I Tube type: Oral Tube size: 7.5 mm Number of attempts: 1 Airway Equipment and Method: Stylet and Oral airway Placement Confirmation: ETT inserted through vocal cords under direct vision,  positive ETCO2 and breath sounds checked- equal and bilateral Secured at: 21 cm Tube secured with: Tape Dental Injury: Teeth and Oropharynx as per pre-operative assessment

## 2017-05-13 NOTE — Transfer of Care (Signed)
Immediate Anesthesia Transfer of Care Note  Patient: Donna Mcintyre  Procedure(s) Performed: LAPAROSCOPIC GASTRIC SLEEVE RESECTION WITH HIATAL HERNIA REPAIR, UPPER ENDO (N/A Abdomen)  Patient Location: PACU  Anesthesia Type:General  Level of Consciousness: awake, alert  and oriented  Airway & Oxygen Therapy: Patient Spontanous Breathing and Patient connected to face mask oxygen  Post-op Assessment: Report given to RN and Post -op Vital signs reviewed and stable  Post vital signs: Reviewed and stable  Last Vitals:  Vitals Value Taken Time  BP 127/78 05/13/2017  3:37 PM  Temp    Pulse 57 05/13/2017  3:39 PM  Resp 18 05/13/2017  3:39 PM  SpO2 100 % 05/13/2017  3:39 PM  Vitals shown include unvalidated device data.  Last Pain:  Vitals:   05/13/17 1538  TempSrc:   PainSc: (P) Asleep         Complications: No apparent anesthesia complications

## 2017-05-13 NOTE — Anesthesia Postprocedure Evaluation (Signed)
Anesthesia Post Note  Patient: Scientist, physiological  Procedure(s) Performed: LAPAROSCOPIC GASTRIC SLEEVE RESECTION WITH HIATAL HERNIA REPAIR, UPPER ENDO (N/A Abdomen)     Patient location during evaluation: PACU Anesthesia Type: General Level of consciousness: awake and alert Pain management: pain level controlled Vital Signs Assessment: post-procedure vital signs reviewed and stable Respiratory status: spontaneous breathing, nonlabored ventilation, respiratory function stable and patient connected to nasal cannula oxygen Cardiovascular status: blood pressure returned to baseline and stable Postop Assessment: no apparent nausea or vomiting Anesthetic complications: no    Last Vitals:  Vitals:   05/13/17 1630 05/13/17 1645  BP: 121/72 125/70  Pulse: (!) 58 (!) 53  Resp: 15 16  Temp: 36.8 C 36.8 C  SpO2: 99% 98%    Last Pain:  Vitals:   05/13/17 1630  TempSrc:   PainSc: Asleep                 Darrio Bade DAVID

## 2017-05-13 NOTE — Progress Notes (Signed)
Patient remains too drowsy to ambulate or use incentive spirometer. Responds to voice. Is OOB in chair. Spouse and daughter in room.  Lina Sar, RN

## 2017-05-14 ENCOUNTER — Encounter (HOSPITAL_COMMUNITY): Payer: Self-pay | Admitting: General Surgery

## 2017-05-14 LAB — COMPREHENSIVE METABOLIC PANEL
ALBUMIN: 3.5 g/dL (ref 3.5–5.0)
ALT: 33 U/L (ref 14–54)
AST: 27 U/L (ref 15–41)
Alkaline Phosphatase: 70 U/L (ref 38–126)
Anion gap: 10 (ref 5–15)
BUN: 9 mg/dL (ref 6–20)
CHLORIDE: 107 mmol/L (ref 101–111)
CO2: 23 mmol/L (ref 22–32)
CREATININE: 0.74 mg/dL (ref 0.44–1.00)
Calcium: 9.3 mg/dL (ref 8.9–10.3)
GFR calc Af Amer: >60 mL/min (ref >60)
GLUCOSE: 197 mg/dL — AB (ref 65–99)
Potassium: 4.6 mmol/L (ref 3.5–5.1)
Sodium: 140 mmol/L (ref 135–145)
Total Bilirubin: 0.5 mg/dL (ref 0.3–1.2)
Total Protein: 7.4 g/dL (ref 6.5–8.1)

## 2017-05-14 LAB — CBC WITH DIFFERENTIAL/PLATELET
BASOS ABS: 0 10*3/uL (ref 0.0–0.1)
BASOS PCT: 0 %
EOS PCT: 0 %
Eosinophils Absolute: 0 10*3/uL (ref 0.0–0.7)
HEMATOCRIT: 40.5 % (ref 36.0–46.0)
Hemoglobin: 12.7 g/dL (ref 12.0–15.0)
Lymphocytes Relative: 9 %
Lymphs Abs: 1.1 10*3/uL (ref 0.7–4.0)
MCH: 28.2 pg (ref 26.0–34.0)
MCHC: 31.4 g/dL (ref 30.0–36.0)
MCV: 89.8 fL (ref 78.0–100.0)
Monocytes Absolute: 0.5 10*3/uL (ref 0.1–1.0)
Monocytes Relative: 4 %
NEUTROS ABS: 10.8 10*3/uL — AB (ref 1.7–7.7)
Neutrophils Relative %: 87 %
PLATELETS: 348 10*3/uL (ref 150–400)
RBC: 4.51 MIL/uL (ref 3.87–5.11)
RDW: 12.7 % (ref 11.5–15.5)
WBC: 12.4 10*3/uL — AB (ref 4.0–10.5)

## 2017-05-14 NOTE — Progress Notes (Signed)
Patient alert and oriented, Post op day 1.  Provided support and encouragement.  Encouraged pulmonary toilet, ambulation and small sips of liquids.  Completed 16 ounces of bari clear fluids and a protein shake without pain/nausea.  All questions answered.  Will continue to monitor.

## 2017-05-14 NOTE — Progress Notes (Signed)
Patient alert and oriented, pain is controlled. Patient is tolerating fluids, advanced to protein shake today, patient is tolerating well.  Reviewed Gastric sleeve discharge instructions with patient and patient is able to articulate understanding.  Provided information on BELT program, Support Group and [facility] outpatient pharmacy. All questions answered, will continue to monitor.  Total fluid in 24 hours 777 Per dehydration protocol call back one week post op

## 2017-05-14 NOTE — Plan of Care (Signed)
Nutrition Education Note  Received consult for diet education per DROP protocol.  Patient about to discharge during RD visit.   Pt with no questions about the following:  Discussed 2 week post op diet with pt. Emphasized that liquids must be non carbonated, non caffeinated, and sugar free. Fluid goals discussed. Pt to follow up with outpatient bariatric RD for further diet progression after 2 weeks. Multivitamins and minerals also reviewed. Teach back method used, pt expressed understanding, expect good compliance.   Diet: First 2 Weeks  You will see the nutritionist about two (2) weeks after your surgery. The nutritionist will increase the types of foods you can eat if you are handling liquids well:  If you have severe vomiting or nausea and cannot handle clear liquids lasting longer than 1 day, call your surgeon  Protein Shake  Drink at least 2 ounces of shake 5-6 times per day  Each serving of protein shakes (usually 8 - 12 ounces) should have a minimum of:  15 grams of protein  And no more than 5 grams of carbohydrate  Goal for protein each day:  Men = 80 grams per day  Women = 60 grams per day  Protein powder may be added to fluids such as non-fat milk or Lactaid milk or Soy milk (limit to 35 grams added protein powder per serving)   Hydration  Slowly increase the amount of water and other clear liquids as tolerated (See Acceptable Fluids)  Slowly increase the amount of protein shake as tolerated  Sip fluids slowly and throughout the day  May use sugar substitutes in small amounts (no more than 6 - 8 packets per day; i.e. Splenda)   Fluid Goal  The first goal is to drink at least 8 ounces of protein shake/drink per day (or as directed by the nutritionist); some examples of protein shakes are Premier Protein, [employer], [employer], EAS Edge HP, and Unjury. See handout from pre-op Bariatric Education Class:  Slowly increase the amount of protein shake you drink as  tolerated  You may find it easier to slowly sip shakes throughout the day  It is important to get your proteins in first  Your fluid goal is to drink 64 - 100 ounces of fluid daily  It may take a few weeks to build up to this  32 oz (or more) should be clear liquids  And  32 oz (or more) should be full liquids (see below for examples)  Liquids should not contain sugar, caffeine, or carbonation   Clear Liquids:  Water or Sugar-free flavored water (i.e. Fruit H2O, Propel)  Decaffeinated coffee or tea (sugar-free)  Sache Lite, Wyler?s Lite, Minute Maid Lite  Sugar-free Jell-O  Bouillon or broth  Sugar-free Popsicle: *Less than 20 calories each; Limit 1 per day   Full Liquids:  Protein Shakes/Drinks + 2 choices per day of other full liquids  Full liquids must be:  No More Than 12 grams of Carbs per serving  No More Than 3 grams of Fat per serving  Strained low-fat cream soup  Non-Fat milk  Fat-free Lactaid Milk  Sugar-free yogurt (Dannon Lite & Fit, [place] yogurt, Oikos Zero)   Tilda Franco, MS, RD, LDN [employer] Inpatient Clinical Dietitian Pager: [phone] After Hours Pager: [phone]

## 2017-05-14 NOTE — Progress Notes (Signed)
Discharge instructions reviewed with patient. Questions answered; patient denies further questions. Spouse here to drive patient home. Lina Sar, RN

## 2017-05-14 NOTE — Progress Notes (Signed)
Patient ID: Donna Mcintyre, female   DOB: [birth year], 37 y.o.   MRN: [id number]   Progress Note: Metabolic and Bariatric Surgery Service   Chief Complaint/Subjective: Had nausea yesterday but did well with liquids. Met water goals and already had a shake  Objective: Vital signs in last 24 hours: Temp:  [97.3 F (36.3 C)-98.6 F (37 C)] 97.3 F (36.3 C) (05/01 0537) Pulse Rate:  [42-68] 47 (05/01 0537) Resp:  [12-18] 14 (05/01 0537) BP: (105-151)/(67-86) 113/67 (05/01 0537) SpO2:  [96 %-100 %] 99 % (05/01 0537) Weight:  [130.1 kg (286 lb 12.8 oz)] 130.1 kg (286 lb 12.8 oz) (04/30 1133)    Intake/Output from previous day: 04/30 0701 - 05/01 0700 In: 3522 [P.O.:777; I.V.:2645; IV Piggyback:100] Out: 9518 [Urine:1050; Blood:20] Intake/Output this shift: No intake/output data recorded.  Lungs: cta, symm  Cardiovascular: reg  Abd: soft, min TTP, dressing CDI  Extremities:  No edema, scds  Neuro: nonfocal, alert, smiling  Lab Results: CBC  Recent Labs    05/13/17 1617 05/14/17 0454  WBC  --  12.4*  HGB 12.8 12.7  HCT 38.7 40.5  PLT  --  348   BMET Recent Labs    05/14/17 0454  NA 140  K 4.6  CL 107  CO2 23  GLUCOSE 197*  BUN 9  CREATININE 0.74  CALCIUM 9.3   PT/INR No results for input(s): LABPROT, INR in the last 72 hours. ABG No results for input(s): PHART, HCO3 in the last 72 hours.  Invalid input(s): PCO2, PO2  Studies/Results:  Anti-infectives: Anti-infectives (From admission, onward)   Start     Dose/Rate Route Frequency Ordered Stop   05/13/17 1130  cefoTEtan in Dextrose 5% (CEFOTAN) IVPB 2 g     2 g Intravenous On call to O.R. 05/13/17 1130 05/13/17 1433      Medications: Scheduled Meds: . acetaminophen (TYLENOL) oral liquid 160 mg/5 mL  650 mg Oral Q6H  . enoxaparin (LOVENOX) injection  30 mg Subcutaneous Q12H  . gabapentin  200 mg Oral Q12H  . protein supplement shake  2 oz Oral Q2H   Continuous Infusions: . dextrose 5 % and  0.45 % NaCl with KCl 20 mEq/L 125 mL/hr at 05/14/17 0119  . famotidine (PEPCID) IV Stopped (05/13/17 2233)   PRN Meds:.diphenhydrAMINE, morphine injection, ondansetron (ZOFRAN) IV, oxyCODONE, promethazine, simethicone  Assessment/Plan: Patient Active Problem List   Diagnosis Date Noted  . Severe obesity (BMI >= 40) ([place]) 05/13/2017  . OSA on CPAP 05/13/2017  . Hiatal hernia 05/13/2017  . Morbid obesity ([place]) 05/13/2017   s/p Procedure(s): LAPAROSCOPIC GASTRIC SLEEVE RESECTION WITH HIATAL HERNIA REPAIR, UPPER ENDO 05/13/2017  Looks great. Doing great with liquids. No fever, tachycardia Cont diet as tolerated.  Start discharging teaching  Disposition:  LOS: 1 day  The patient should be discharged from the hospital today  Greer Pickerel, MD [phone] [facility]

## 2017-05-15 NOTE — Discharge Summary (Signed)
Physician Discharge Summary  Donna Mcintyre ULA:[id number] DOB: Feb 05, 1980 DOA: 05/13/2017  PCP: Ronita Hipps, MD  Admit date: 05/13/2017 Discharge date: 05/14/2017  Recommendations for Outpatient Follow-up:   Follow-up Information    Greer Pickerel, MD. Go on 06/04/2017.   Specialty:  General Surgery Why:  at 1015. Please arrive 15 minutes early for scheduled appointment.  Thank you Contact information: [address] [place] [address] [phone]        Carlena Hurl, PA-C .   Specialty:  General Surgery Contact information: [place] [address] [phone]          Discharge Diagnoses:  Principal Problem:   Severe obesity (BMI >= 40) (HCC) Active Problems:   OSA on CPAP   Hiatal hernia   Morbid obesity ([place])   Surgical Procedure: Laparoscopic Sleeve Gastrectomy with hiatal hernia repair, upper endoscopy  Discharge Condition: Good Disposition: Home  Diet recommendation: Postoperative sleeve gastrectomy diet (liquids only)  Filed Weights   05/13/17 1133  Weight: 130.1 kg (286 lb 12.8 oz)     Hospital Course:  The patient was admitted for a planned laparoscopic sleeve gastrectomy. Please see operative note. Preoperatively the patient was given 5000 units of subcutaneous heparin for DVT prophylaxis. Postoperative prophylactic Lovenox dosing was started on the evening of postoperative day 0. ERAS protocol was used. On the evening of postoperative day 0, the patient was started on water and ice chips. On postoperative day 1 the patient had no fever or tachycardia and was tolerating water in their diet was gradually advanced throughout the day. The patient was ambulating without difficulty. Their vital signs are stable without fever or tachycardia. Their hemoglobin had remained stable. The patient was maintained on their home settings for CPAP therapy. The patient had received discharge instructions and counseling. They were  deemed stable for discharge and had met discharge criteria   Discharge Instructions  Discharge Instructions    Ambulate hourly while awake   Complete by:  As directed    Call MD for:  difficulty breathing, headache or visual disturbances   Complete by:  As directed    Call MD for:  persistant dizziness or light-headedness   Complete by:  As directed    Call MD for:  persistant nausea and vomiting   Complete by:  As directed    Call MD for:  redness, tenderness, or signs of infection (pain, swelling, redness, odor or green/yellow discharge around incision site)   Complete by:  As directed    Call MD for:  severe uncontrolled pain   Complete by:  As directed    Call MD for:  temperature >101 F   Complete by:  As directed    Diet bariatric full liquid   Complete by:  As directed    Discharge instructions   Complete by:  As directed    See bariatric discharge instructions   Incentive spirometry   Complete by:  As directed    Perform hourly while awake     Allergies as of 05/14/2017      Reactions   Levaquin [levofloxacin In D5w] Other (See Comments)   Throat swells      Medication List    TAKE these medications   ondansetron 4 MG disintegrating tablet Commonly known as:  ZOFRAN-ODT TAKE 4 MG BY MOUTH EVERY SIX HOURS, AS NEEDED FOR NAUSEA   oxyCODONE 5 MG/5ML solution Commonly known as:  ROXICODONE TAKE 5-10ML BY MOUTH EVERY 4  HOURS AS NEEDED FOR PAIN   pantoprazole 40 MG tablet Commonly known as:  PROTONIX Take 40 mg by mouth daily.      Follow-up Information    Greer Pickerel, MD. Go on 06/04/2017.   Specialty:  General Surgery Why:  at 1015. Please arrive 15 minutes early for scheduled appointment.  Thank you Contact information: [address] [place] [address] [phone]        Carlena Hurl, PA-C .   Specialty:  General Surgery Contact information: [place] [address] [phone]            The results  of significant diagnostics from this hospitalization (including imaging, microbiology, ancillary and laboratory) are listed below for reference.    Significant Diagnostic Studies: No results found.  Labs: Basic Metabolic Panel: Recent Labs  Lab 05/14/17 0454  NA 140  K 4.6  CL 107  CO2 23  GLUCOSE 197*  BUN 9  CREATININE 0.74  CALCIUM 9.3   Liver Function Tests: Recent Labs  Lab 05/14/17 0454  AST 27  ALT 33  ALKPHOS 70  BILITOT 0.5  PROT 7.4  ALBUMIN 3.5    CBC: Recent Labs  Lab 05/13/17 1617 05/14/17 0454  WBC  --  12.4*  NEUTROABS  --  10.8*  HGB 12.8 12.7  HCT 38.7 40.5  MCV  --  89.8  PLT  --  348    CBG: No results for input(s): GLUCAP in the last 168 hours.  Principal Problem:   Severe obesity (BMI >= 40) (HCC) Active Problems:   OSA on CPAP   Hiatal hernia   Morbid obesity ([place])   Time coordinating discharge: 10 min  Signed:  Gayland Curry, MD [facility], [place] [phone] 05/15/2017, 3:18 PM

## 2017-05-19 ENCOUNTER — Telehealth (HOSPITAL_COMMUNITY): Payer: Self-pay

## 2017-05-19 NOTE — Telephone Encounter (Addendum)
Patient called to discuss post bariatric surgery follow up questions.  See below:   1.  Tell me about your pain and pain management?no pain at this time  2.  Let's talk about fluid intake.  How much total fluid are you taking in?58 ounces of fluid  3.  How much protein have you taken in the last 2 days?60 grams of protein  4.  Have you had nausea?  Tell me about when have experienced nausea and what you did to help?nausea only when drinks too much, has not needed medication  5.  Has the frequency or color changed with your urine?urine clear and light in color  6.  Tell me what your incisions look like?steri strips have come off no problems  7.  Have you been passing gas? BM?passing gas has had several bms since surgery  8.  If a problem or question were to arise who would you call?  Do you know contact numbers for [facility], [facility], and [facility]? Aware of all contact information  9.  How has the walking going?walking every hour, went to gym and walked on treadmill last night for extended period of time  10.  How are your vitamins and calcium going?  How are you taking them?mvi twice per day and calcium three times per day  Patient had family member with serious medical event unable to return call until today due to time spent with family.  Patient only question was regarding starting weights, instructed to wait until MD appointment.

## 2017-05-26 ENCOUNTER — Ambulatory Visit: Payer: BLUE CROSS/BLUE SHIELD | Admitting: Adult Health

## 2017-05-27 ENCOUNTER — Ambulatory Visit: Payer: Self-pay

## 2017-06-16 ENCOUNTER — Emergency Department (HOSPITAL_COMMUNITY)
Admission: EM | Admit: 2017-06-16 | Discharge: 2017-06-16 | Disposition: A | Discharge status: Home / Self Care | Payer: BLUE CROSS/BLUE SHIELD | Attending: Emergency Medicine | Admitting: Emergency Medicine

## 2017-06-16 ENCOUNTER — Emergency Department (HOSPITAL_COMMUNITY): Payer: BLUE CROSS/BLUE SHIELD

## 2017-06-16 ENCOUNTER — Encounter (HOSPITAL_COMMUNITY): Payer: Self-pay | Admitting: *Deleted

## 2017-06-16 ENCOUNTER — Other Ambulatory Visit: Payer: Self-pay

## 2017-06-16 DIAGNOSIS — Z87891 Personal history of nicotine dependence: Secondary | ICD-10-CM | POA: Insufficient documentation

## 2017-06-16 DIAGNOSIS — R109 Unspecified abdominal pain: Secondary | ICD-10-CM | POA: Diagnosis present

## 2017-06-16 DIAGNOSIS — Z79899 Other long term (current) drug therapy: Secondary | ICD-10-CM | POA: Diagnosis not present

## 2017-06-16 DIAGNOSIS — J45909 Unspecified asthma, uncomplicated: Secondary | ICD-10-CM | POA: Diagnosis not present

## 2017-06-16 LAB — URINALYSIS, ROUTINE W REFLEX MICROSCOPIC
Bacteria, UA: NONE SEEN
Bilirubin Urine: NEGATIVE
GLUCOSE, UA: NEGATIVE mg/dL
KETONES UR: 5 mg/dL — AB
LEUKOCYTES UA: NEGATIVE
Nitrite: NEGATIVE
PH: 6 (ref 5.0–8.0)
PROTEIN: NEGATIVE mg/dL
Specific Gravity, Urine: >1.046 — ABNORMAL HIGH (ref 1.005–1.030)

## 2017-06-16 LAB — COMPREHENSIVE METABOLIC PANEL
ALT: 27 U/L (ref 14–54)
AST: 16 U/L (ref 15–41)
Albumin: 3.9 g/dL (ref 3.5–5.0)
Alkaline Phosphatase: 76 U/L (ref 38–126)
Anion gap: 11 (ref 5–15)
BUN: 10 mg/dL (ref 6–20)
CHLORIDE: 108 mmol/L (ref 101–111)
CO2: 23 mmol/L (ref 22–32)
Calcium: 9.3 mg/dL (ref 8.9–10.3)
Creatinine, Ser: 0.72 mg/dL (ref 0.44–1.00)
GFR calc Af Amer: >60 mL/min (ref >60)
GFR calc non Af Amer: >60 mL/min (ref >60)
Glucose, Bld: 128 mg/dL — ABNORMAL HIGH (ref 65–99)
POTASSIUM: 3.7 mmol/L (ref 3.5–5.1)
Sodium: 142 mmol/L (ref 135–145)
Total Bilirubin: 0.7 mg/dL (ref 0.3–1.2)
Total Protein: 7.3 g/dL (ref 6.5–8.1)

## 2017-06-16 LAB — CBC
HEMATOCRIT: 40 % (ref 36.0–46.0)
Hemoglobin: 13.2 g/dL (ref 12.0–15.0)
MCH: 29.3 pg (ref 26.0–34.0)
MCHC: 33 g/dL (ref 30.0–36.0)
MCV: 88.9 fL (ref 78.0–100.0)
Platelets: 271 10*3/uL (ref 150–400)
RBC: 4.5 MIL/uL (ref 3.87–5.11)
RDW: 13.1 % (ref 11.5–15.5)
WBC: 11.7 10*3/uL — ABNORMAL HIGH (ref 4.0–10.5)

## 2017-06-16 LAB — I-STAT BETA HCG BLOOD, ED (MC, WL, AP ONLY): I-stat hCG, quantitative: <5 m[IU]/mL (ref <5)

## 2017-06-16 LAB — LIPASE, BLOOD: LIPASE: 29 U/L (ref 11–51)

## 2017-06-16 MED ORDER — ONDANSETRON HCL 4 MG/2ML IJ SOLN
4.0000 mg | Freq: Once | INTRAMUSCULAR | Status: AC
  Administered 2017-06-16: 4 mg via INTRAVENOUS
  Filled 2017-06-16: qty 2

## 2017-06-16 MED ORDER — SODIUM CHLORIDE 0.9 % IV BOLUS
1000.0000 mL | Freq: Once | INTRAVENOUS | Status: AC
  Administered 2017-06-16: 1000 mL via INTRAVENOUS

## 2017-06-16 MED ORDER — ONDANSETRON HCL 4 MG PO TABS: 4.0000 mg | ORAL_TABLET | Freq: Four times a day (QID) | ORAL | 0 refills | Status: AC

## 2017-06-16 MED ORDER — MORPHINE SULFATE (PF) 4 MG/ML IV SOLN
4.0000 mg | Freq: Once | INTRAVENOUS | Status: AC
  Administered 2017-06-16: 4 mg via INTRAVENOUS
  Filled 2017-06-16: qty 1

## 2017-06-16 MED ORDER — IOPAMIDOL (ISOVUE-300) INJECTION 61%
100.0000 mL | Freq: Once | INTRAVENOUS | Status: AC | PRN
  Administered 2017-06-16: 100 mL via INTRAVENOUS

## 2017-06-16 MED ORDER — IOPAMIDOL (ISOVUE-300) INJECTION 61%
INTRAVENOUS | Status: AC
  Filled 2017-06-16: qty 100

## 2017-06-16 MED ORDER — TRAMADOL HCL 50 MG PO TABS: 50.0000 mg | ORAL_TABLET | Freq: Four times a day (QID) | ORAL | 0 refills | Status: AC | PRN

## 2017-06-16 MED ORDER — IOHEXOL 300 MG/ML  SOLN
15.0000 mL | Freq: Once | INTRAMUSCULAR | Status: AC | PRN
  Administered 2017-06-16: 15 mL via ORAL

## 2017-06-16 NOTE — ED Provider Notes (Signed)
[facility]-EMERGENCY DEPT Provider Note   CSN: [id number] Arrival date & time: 06/16/17  0001     History   Chief Complaint Chief Complaint  Patient presents with  . Abdominal Pain    right    HPI Donna Mcintyre is a 37 y.o. female.  Patient presents to the ER for evaluation of right flank pain.  She reports that the pain began around 1030 tonight.  Pain is in the right lateral aspect of the abdomen, has been associated with some nausea and she vomited once.  She has not noticed any urinary symptoms.  She does have a history of kidney stones, reports that the pain feels similar, but it is not in the usual spot.  Normally her pain is in her lower back.  Patient is concerned because she recently had bariatric surgery.  She is not, however, having any upper abdominal pain.     Past Medical History:  Diagnosis Date  . Anxiety   . Asthma   . Back pain   . Headache    Migraines  . Kidney stone   . Morbid obesity (HCC)   . Seizures [facility])     Patient Active Problem List   Diagnosis Date Noted  . Severe obesity (BMI >= 40) (HCC) 05/13/2017  . OSA on CPAP 05/13/2017  . Hiatal hernia 05/13/2017  . Morbid obesity (HCC) 05/13/2017    Past Surgical History:  Procedure Laterality Date  . CESAREAN SECTION    . LAPAROSCOPIC GASTRIC SLEEVE RESECTION N/A 05/13/2017   Procedure: LAPAROSCOPIC GASTRIC SLEEVE RESECTION WITH HIATAL HERNIA REPAIR, UPPER ENDO;  Surgeon: Gaynelle Adu, MD;  Location: [facility] ORS;  Service: General;  Laterality: N/A;  . WISDOM TOOTH EXTRACTION       OB History   None      Home Medications    Prior to Admission medications   Medication Sig Start Date End Date Taking? Authorizing Provider  ferrous sulfate 325 (65 FE) MG EC tablet Take 325 mg by mouth 3 (three) times daily with meals.   Yes [provider]  Multiple Vitamin (MULTIVITAMIN WITH MINERALS) TABS tablet Take 1 tablet by mouth daily.   Yes [provider]    ondansetron (ZOFRAN) 4 MG tablet Take 1 tablet (4 mg total) by mouth every 6 (six) hours. 06/16/17   Gilda Crease, MD  traMADol (ULTRAM) 50 MG tablet Take 1 tablet (50 mg total) by mouth every 6 (six) hours as needed. 06/16/17   Gilda Crease, MD    Family History Family History  Problem Relation Age of Onset  . Asthma Other   . Cancer Other   . Hypertension Other   . Heart disease Other   . Diabetes Other   . Arthritis Mother   . Alcohol abuse Father   . Arthritis Father   . Hypertension Father   . Heart failure Father   . Alcohol abuse Brother   . Heart disease Paternal Grandmother   . Cancer Paternal Grandfather     Social History Social History   Tobacco Use  . Smoking status: Former Smoker    Last attempt to quit: 2017    Years since quitting: 2.4  . Smokeless tobacco: Never Used  Substance Use Topics  . Alcohol use: Yes    Comment: Socially  . Drug use: No     Allergies   Levaquin [levofloxacin in d5w]   Review of Systems Review of Systems  Genitourinary: Positive for flank pain.  All other systems reviewed and are negative.    Physical Exam Updated Vital Signs BP 124/85   Pulse (!) 50   Temp 98.5 F (36.9 C) (Oral)   Resp 17   Ht 5\' 4"  (1.626 m)   Wt 122 kg (269 lb)   LMP 06/14/2017 (Exact Date) Comment: negative beta HCG 06/16/17  SpO2 98%   BMI 46.17 kg/m   Physical Exam  Constitutional: She is oriented to person, place, and time. She appears well-developed and well-nourished. No distress.  HENT:  Head: Normocephalic and atraumatic.  Right Ear: Hearing normal.  Left Ear: Hearing normal.  Nose: Nose normal.  Mouth/Throat: Oropharynx is clear and moist and mucous membranes are normal.  Eyes: Pupils are equal, round, and reactive to light. Conjunctivae and EOM are normal.  Neck: Normal range of motion. Neck supple.  Cardiovascular: Regular rhythm, S1 normal and S2 normal. Exam reveals no gallop and no friction rub.  No  murmur heard. Pulmonary/Chest: Effort normal and breath sounds normal. No respiratory distress. She exhibits no tenderness.  Abdominal: Soft. Normal appearance and bowel sounds are normal. There is no hepatosplenomegaly. There is no tenderness. There is no rebound, no guarding, no tenderness at McBurney's point and negative Murphy's sign. No hernia.  No true abdominal tenderness, does have pain in the mid axillary line lateral abdomen with painful range of motion  Musculoskeletal: Normal range of motion.  Neurological: She is alert and oriented to person, place, and time. She has normal strength. No cranial nerve deficit or sensory deficit. Coordination normal. GCS eye subscore is 4. GCS verbal subscore is 5. GCS motor subscore is 6.  Skin: Skin is warm, dry and intact. No rash noted. No cyanosis.  Psychiatric: She has a normal mood and affect. Her speech is normal and behavior is normal. Thought content normal.  Nursing note and vitals reviewed.    ED Treatments / Results  Labs (all labs ordered are listed, but only abnormal results are displayed) Labs Reviewed  COMPREHENSIVE METABOLIC PANEL - Abnormal; Notable for the following components:      Result Value   Glucose, Bld 128 (*)    All other components within normal limits  CBC - Abnormal; Notable for the following components:   WBC 11.7 (*)    All other components within normal limits  URINALYSIS, ROUTINE W REFLEX MICROSCOPIC - Abnormal; Notable for the following components:   APPearance HAZY (*)    Specific Gravity, Urine >1.046 (*)    Hgb urine dipstick LARGE (*)    Ketones, ur 5 (*)    RBC / HPF >50 (*)    All other components within normal limits  LIPASE, BLOOD  I-STAT BETA HCG BLOOD, ED ([facility], [facility], [facility] ONLY)    EKG None  Radiology Ct Abdomen Pelvis W Contrast  Result Date: 06/16/2017 CLINICAL DATA:  Status post recent bariatric sleeve surgery. Acute onset of generalized abdominal pain and vomiting. Leukocytosis. EXAM: CT  ABDOMEN AND PELVIS WITH CONTRAST TECHNIQUE: Multidetector CT imaging of the abdomen and pelvis was performed using the standard protocol following bolus administration of intravenous contrast. CONTRAST:  100mL ISOVUE-300 IOPAMIDOL (ISOVUE-300) INJECTION 61% COMPARISON:  CT of the abdomen and pelvis performed 11/09/2015 FINDINGS: Lower chest: The visualized lung bases are grossly clear. The visualized portions of the mediastinum are unremarkable. Hepatobiliary: A nonspecific 1.1 cm focus of contrast blush is noted at the left hepatic dome, corresponding to the hypodense focus on CT from 2017. The liver is otherwise unremarkable. The gallbladder  is unremarkable in appearance. The common bile duct remains normal in caliber. Pancreas: The pancreas is within normal limits. Spleen: The spleen is unremarkable in appearance. Adrenals/Urinary Tract: The adrenal glands are unremarkable in appearance. The kidneys are within normal limits. There is no evidence of hydronephrosis. No renal or ureteral stones are identified. No perinephric stranding is seen. Stomach/Bowel: The patient is status post sleeve gastrectomy. The remaining stomach is unremarkable in appearance. The small bowel is within normal limits. The appendix is normal in caliber, without evidence of appendicitis. The colon is unremarkable in appearance. Vascular/Lymphatic: The abdominal aorta is unremarkable in appearance. The inferior vena cava is grossly unremarkable. No retroperitoneal lymphadenopathy is seen. No pelvic sidewall lymphadenopathy is identified. Reproductive: The bladder is mildly distended and grossly unremarkable. The uterus is grossly unremarkable in appearance. The ovaries are relatively symmetric. No suspicious adnexal masses are seen. Other: No additional soft tissue abnormalities are seen. Musculoskeletal: No acute osseous abnormalities are identified. The visualized musculature is unremarkable in appearance. IMPRESSION: 1. Status post  sleeve gastrectomy. Remaining stomach is unremarkable in appearance. 2. Nonspecific 1.1 cm focus of contrast blush at the left hepatic dome, corresponding to the hypodense focus on CT from 2017. This is stable and benign in appearance. Electronically Signed   By: Roanna Raider M.D.   On: 06/16/2017 03:27    Procedures Procedures (including critical care time)  Medications Ordered in ED Medications  sodium chloride 0.9 % bolus 1,000 mL (0 mLs Intravenous Stopped 06/16/17 0245)  morphine 4 MG/ML injection 4 mg (4 mg Intravenous Given 06/16/17 0212)  ondansetron (ZOFRAN) injection 4 mg (4 mg Intravenous Given 06/16/17 0212)  iopamidol (ISOVUE-300) 61 % injection 100 mL (100 mLs Intravenous Contrast Given 06/16/17 0308)  iohexol (OMNIPAQUE) 300 MG/ML solution 15 mL (15 mLs Oral Contrast Given 06/16/17 0308)     Initial Impression / Assessment and Plan / ED Course  I have reviewed the triage vital signs and the nursing notes.  Pertinent labs & imaging results that were available during my care of the patient were reviewed by me and considered in my medical decision making (see chart for details).     Patient presents with nonspecific lateral abdominal pain.  She does have tenderness in the lateral aspect of the abdomen but also pain with movement.  Lab work was unremarkable.  Because she recently had a gastric sleeve placed, CT scan was performed.  No abnormality noted with the gastric sleeve, no other abnormality including no signs of ureterolithiasis.  Pain is most likely musculoskeletal.  Final Clinical Impressions(s) / ED Diagnoses   Final diagnoses:  Abdominal wall pain    ED Discharge Orders        Ordered    traMADol (ULTRAM) 50 MG tablet  Every 6 hours PRN     06/16/17 0431    ondansetron (ZOFRAN) 4 MG tablet  Every 6 hours     06/16/17 0431       Gilda Crease, MD 06/16/17 [phone]

## 2017-06-16 NOTE — ED Triage Notes (Signed)
Pt stated "I had bariatric surgery 4/30, began having abd pain about 2230, vomiting on way to hospital."

## 2017-10-07 ENCOUNTER — Telehealth: Payer: Self-pay | Admitting: *Deleted

## 2017-10-07 NOTE — Telephone Encounter (Signed)
Patient has follow up tomorrow. Per [employer] she's not used her CPAP since April. Called patient and LVM re: advised her that it appears she's not been using her CPAP. Advised  if she has used it to bring it with her to her FU tomorrow at 9:30. Otherwise asked she call when this office opens at 8 am.

## 2017-10-08 ENCOUNTER — Ambulatory Visit: Payer: BLUE CROSS/BLUE SHIELD | Admitting: Adult Health

## 2017-10-08 NOTE — Telephone Encounter (Signed)
Patient was no show for follow up with NP today.  

## 2017-10-09 ENCOUNTER — Encounter: Payer: Self-pay | Admitting: Adult Health

## 2018-11-06 ENCOUNTER — Encounter (HOSPITAL_COMMUNITY): Payer: Self-pay

## 2019-02-27 IMAGING — CT CT ABD-PELV W/ CM
2 of 4 series · 16 of 46 positions shown, 18 images · IV contrast (ISOVUE)
Comparison: CT of the abdomen and pelvis performed 11/09/2015

CLINICAL DATA: Status post recent bariatric sleeve surgery. Acute
onset of generalized abdominal pain and vomiting. Leukocytosis.

EXAM:
CT ABDOMEN AND PELVIS WITH CONTRAST
TECHNIQUE: Multidetector CT imaging of the abdomen and pelvis was performed
using the standard protocol following bolus administration of
intravenous contrast.
CONTRAST:  100mL A2NZ8Z-YMM IOPAMIDOL (A2NZ8Z-YMM) INJECTION 61%

[Series 2: axial st · axial · 0.75mm/px · z∈[+1099,+1479]mm · 13 of 86 slices shown, 15 images]
[im 5/86  soft-tissue]
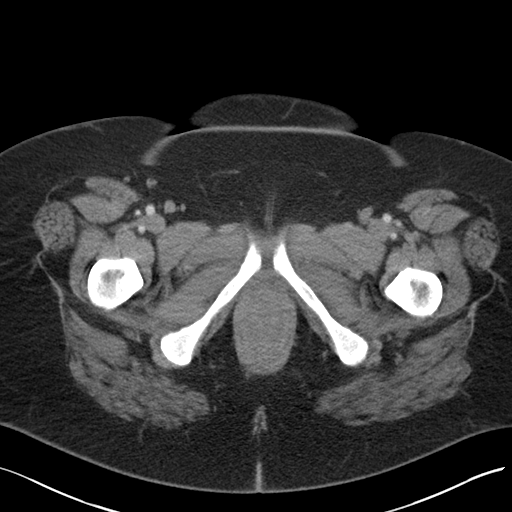
[im 5/86  bone]
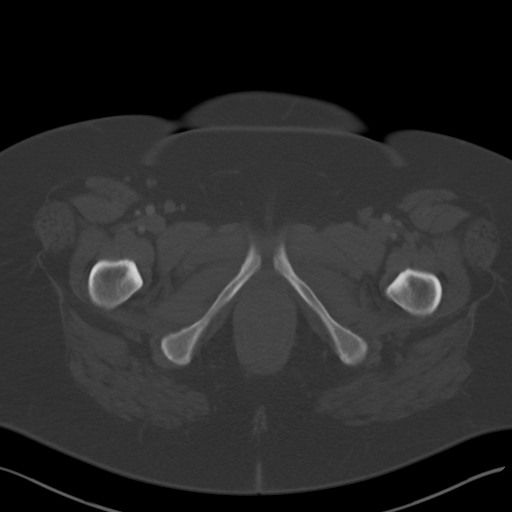
[im 13/86  soft-tissue]
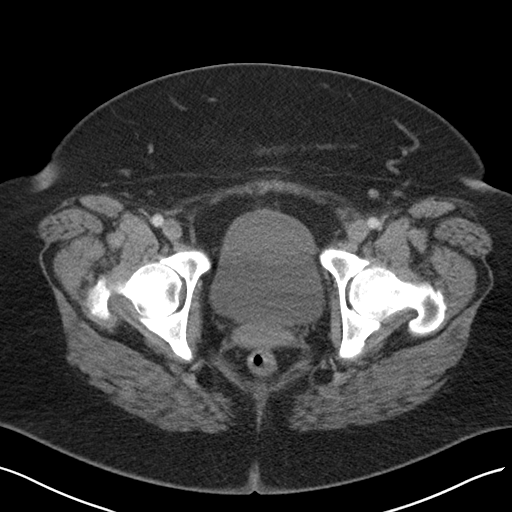
[im 18/86  soft-tissue]
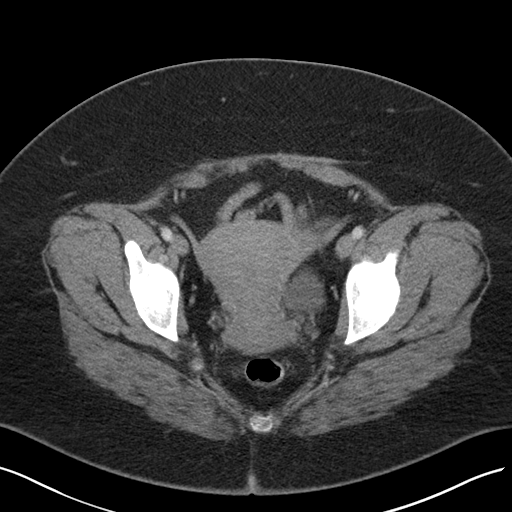
[im 26/86  soft-tissue]
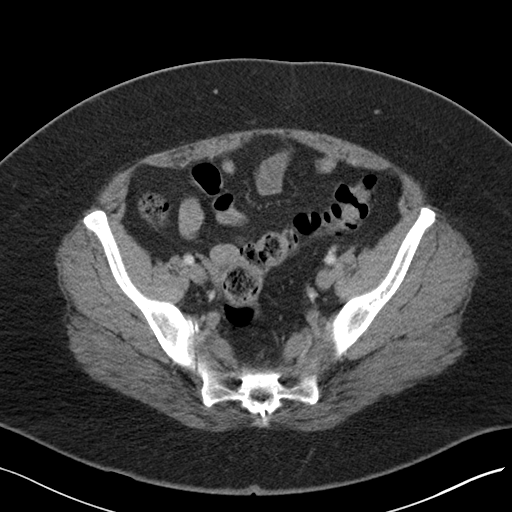
[im 30/86  soft-tissue]
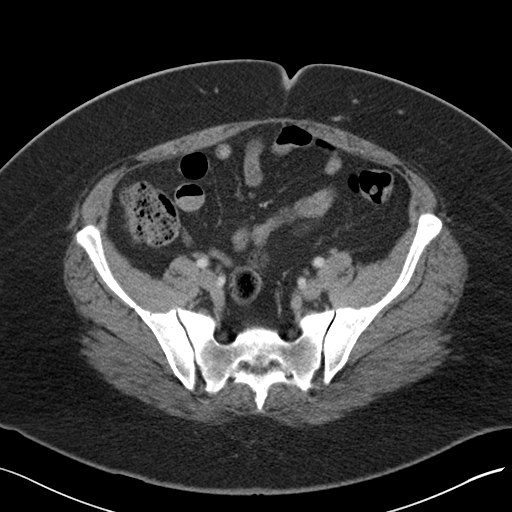
[im 39/86  soft-tissue]
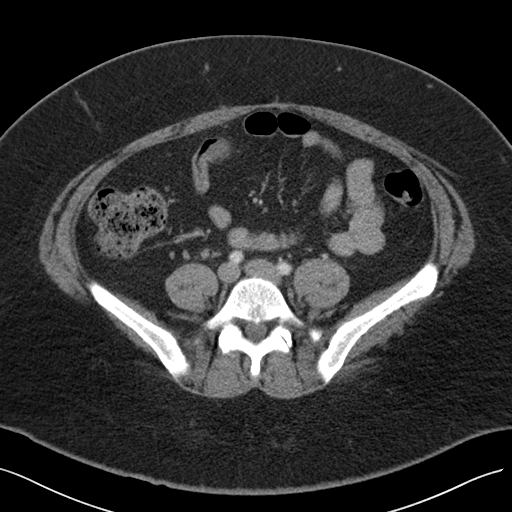
[im 43/86  soft-tissue]
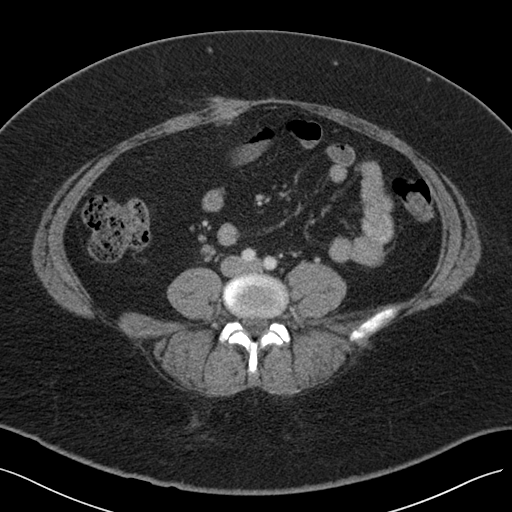
[im 47/86  soft-tissue]
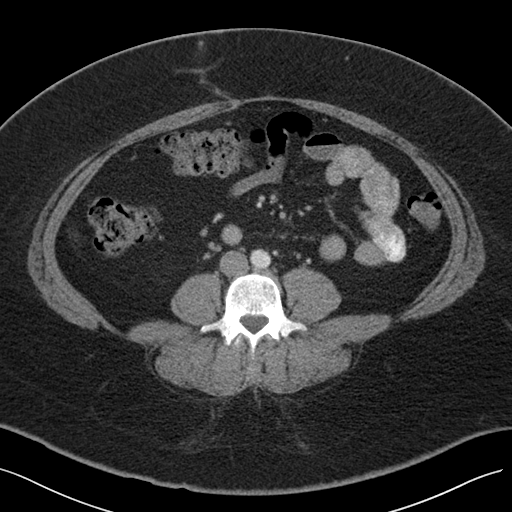
[im 56/86  soft-tissue]
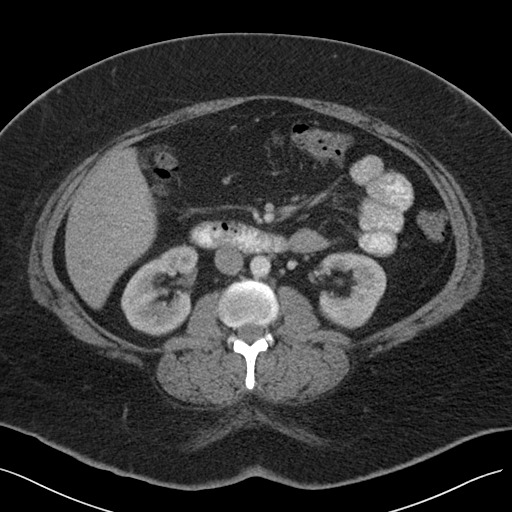
[im 56/86  bone]
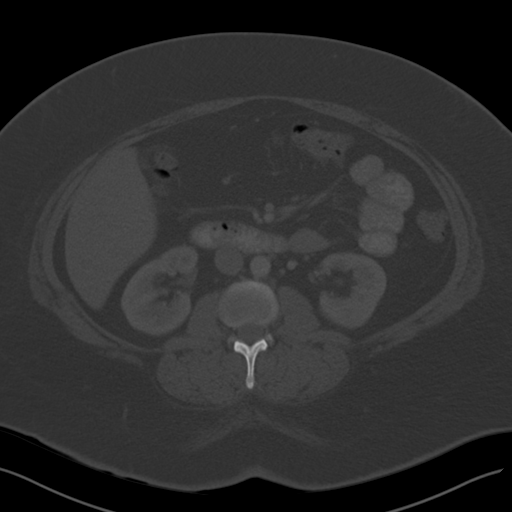
[im 60/86  soft-tissue]
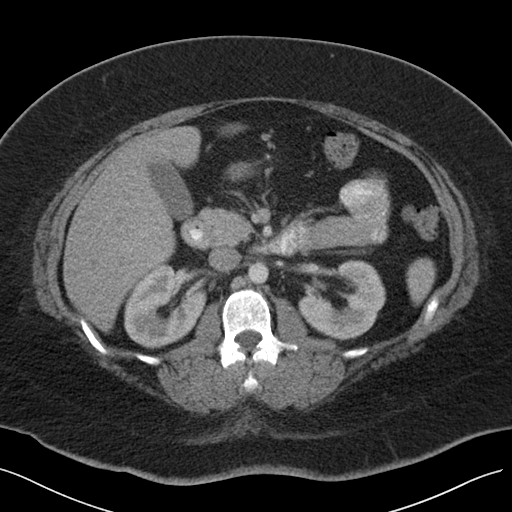
[im 69/86  soft-tissue]
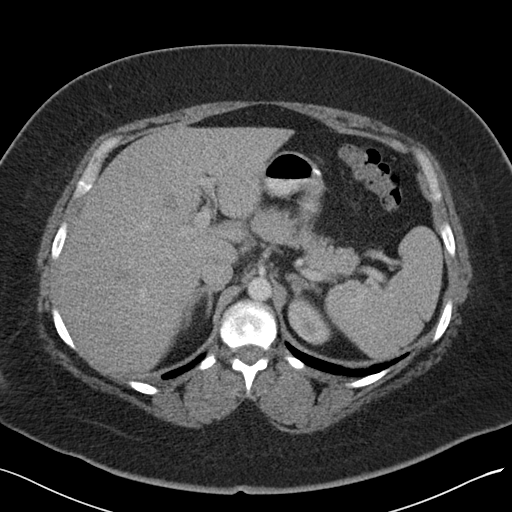
[im 73/86  soft-tissue]
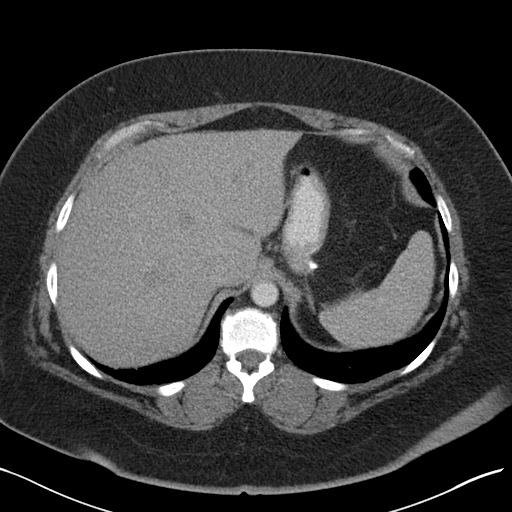
[im 81/86  soft-tissue]
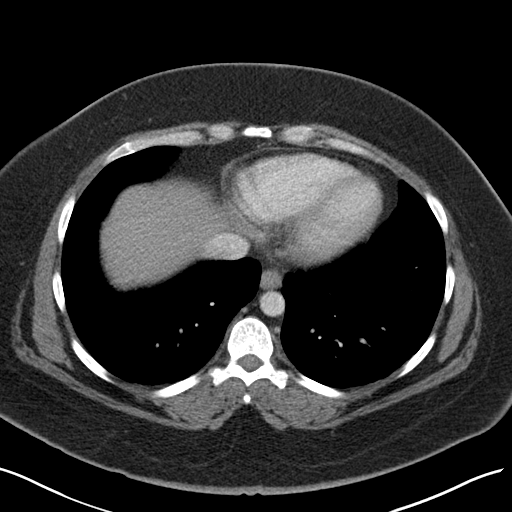

[Series 4: coronal st · coronal · 0.77mm/px · 3 of 103 slices shown]
[im 35/103  soft-tissue]
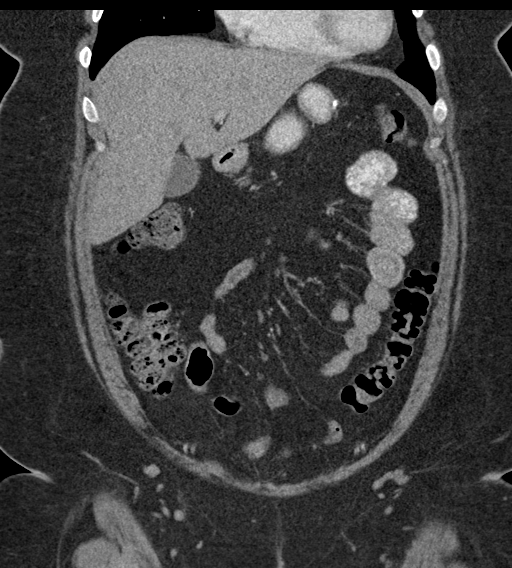
[im 46/103  soft-tissue]
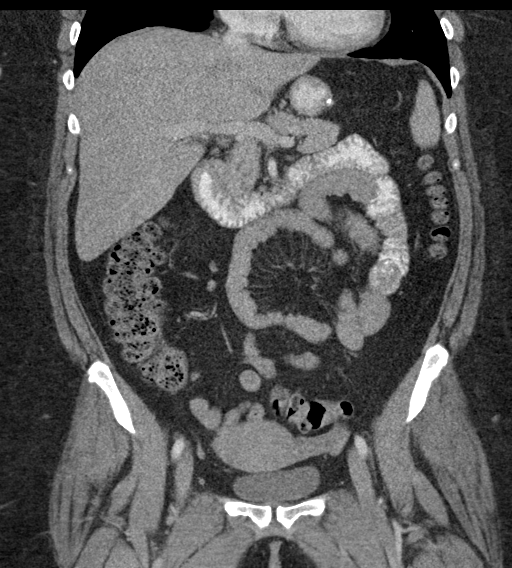
[im 57/103  soft-tissue]
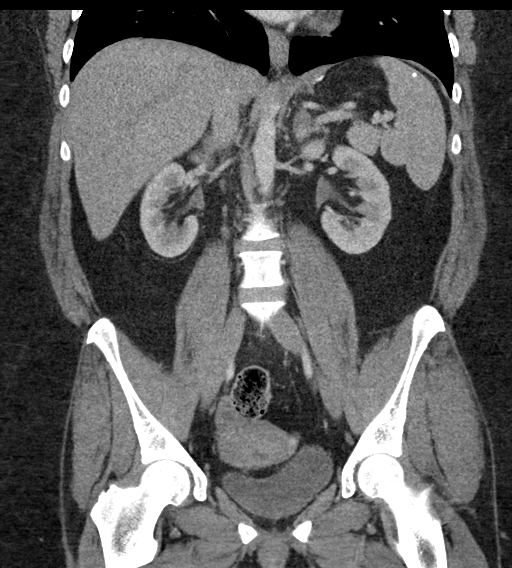

[16 of 46 positions shown; findings below may reference images not displayed]

FINDINGS: Lower chest: The visualized lung bases are grossly clear. The
visualized portions of the mediastinum are unremarkable.

Hepatobiliary: A nonspecific 1.1 cm focus of contrast blush is noted
at the left hepatic dome, corresponding to the hypodense focus on CT
from 1670. The liver is otherwise unremarkable. The gallbladder is
unremarkable in appearance. The common bile duct remains normal in
caliber.

Pancreas: The pancreas is within normal limits.

Spleen: The spleen is unremarkable in appearance.

Adrenals/Urinary Tract: The adrenal glands are unremarkable in
appearance. The kidneys are within normal limits. There is no
evidence of hydronephrosis. No renal or ureteral stones are
identified. No perinephric stranding is seen.

Stomach/Bowel: The patient is status post sleeve gastrectomy. The
remaining stomach is unremarkable in appearance. The small bowel is
within normal limits. The appendix is normal in caliber, without
evidence of appendicitis. The colon is unremarkable in appearance.

Vascular/Lymphatic: The abdominal aorta is unremarkable in
appearance. The inferior vena cava is grossly unremarkable. No
retroperitoneal lymphadenopathy is seen. No pelvic sidewall
lymphadenopathy is identified.

Reproductive: The bladder is mildly distended and grossly
unremarkable. The uterus is grossly unremarkable in appearance. The
ovaries are relatively symmetric. No suspicious adnexal masses are
seen.

Other: No additional soft tissue abnormalities are seen.

Musculoskeletal: No acute osseous abnormalities are identified. The
visualized musculature is unremarkable in appearance.
IMPRESSION: 1. Status post sleeve gastrectomy. Remaining stomach is unremarkable
in appearance.
2. Nonspecific 1.1 cm focus of contrast blush at the left hepatic
dome, corresponding to the hypodense focus on CT from 1670. This is
stable and benign in appearance.

## 2019-12-03 ENCOUNTER — Encounter (HOSPITAL_COMMUNITY): Payer: Self-pay

## 2020-09-20 ENCOUNTER — Other Ambulatory Visit: Payer: Self-pay

## 2020-09-20 ENCOUNTER — Ambulatory Visit (HOSPITAL_COMMUNITY)
Admission: EM | Admit: 2020-09-20 | Discharge: 2020-09-20 | Disposition: A | Discharge status: Home / Self Care | Payer: No Typology Code available for payment source

## 2020-09-20 DIAGNOSIS — F321 Major depressive disorder, single episode, moderate: Secondary | ICD-10-CM

## 2020-09-20 NOTE — Discharge Instructions (Addendum)

## 2020-09-20 NOTE — ED Provider Notes (Signed)
[facility]al [facility] Medical Screening Exam  Patient Name: Donna Mcintyre MRN: [id number] Date of Evaluation: 09/20/20 Chief Complaint:   Diagnosis:  Final diagnoses:  Current moderate episode of major depressive disorder without prior episode (HCC)    History of Present illness: Donna Mcintyre is a 40 y.o. female.  Patient presents voluntarily to [facility] behavioral health for walk-in assessment.  Patient's husband, Asher Muir, remains present during assessment at patient's request. She reports "there was trauma for the first half of my life and the second half of my life I have been raising children but for the last 2 to 4 months I feel like I cannot control my emotions."  She reports feeling that her marriage is suffering related to her emotional outbursts. She reports recent stressors include her husband's "emotional infidelity" with a coworker, states she feels she can no longer trust her husband while he is working.  Additional stressor includes the death of her father, this occurred approximately 3 years ago. She is not currently followed by outpatient psychiatry, no current psychotropic medications.  She reports she has been seen by marriage counseling several times during marriage.  Mayerly would prefer not to follow-up with [facility] in [place]. Patient is assessed face-to-face by nurse practitioner.  She is seated in assessment area, no acute distress.  She is alert and oriented, pleasant and cooperative during assessment.  She reports depressed mood with congruent affect.  She denies both suicidal and homicidal ideations.  She endorses 2 prior suicide attempts, last attempt approximately 20 years ago.  She denies nonsuicidal self-harm behavior. She contracts verbally for safety with this Clinical research associate.  She has normal speech and behavior.  She denies both auditory and visual hallucinations.  Patient is able to converse coherently with goal-directed thoughts and no distractibility  or preoccupation.   She denies paranoia.  Objectively there is no evidence of psychosis/mania or delusional thinking. Patient resides in [place] with her husband, 2 adult children and 67 year old child.  There are weapons in the home, husband will secure these weapons, reports patient has no access to these weapons.  She is employed in [employer], works night shift.  She reports decreased sleep, sleeping an average of 4 hours per night.  She has used several over-the-counter medications with no improvement.  She denies alcohol and substance use.  She endorses appetite that is "up and down" she has recently gained weight without trying.  Patient offered support and encouragement.  Patient's husband, Asher Muir, denies concern for patient safety.   Psychiatric Specialty Exam  Presentation  General Appearance:Appropriate for Environment; Casual  Eye Contact:Good  Speech:Clear and Coherent; Normal Rate  Speech Volume:Normal  Handedness:Right   Mood and Affect  Mood:Depressed  Affect:Depressed   Thought Process  Thought Processes:Coherent; Goal Directed; Linear  Descriptions of Associations:Intact  Orientation:Full (Time, Place and Person)  Thought Content:Logical; WDL    Hallucinations:None  Ideas of Reference:None  Suicidal Thoughts:No  Homicidal Thoughts:No   Sensorium  Memory:Immediate Good; Recent Good; Remote Good  Judgment:Good  Insight:Good   Executive Functions  Concentration:Good  Attention Span:Good  Recall:Good  Fund of Knowledge:Good  Language:Good   Psychomotor Activity  Psychomotor Activity:Normal   Assets  Assets:Communication Skills; Desire for Improvement; Financial Resources/Insurance; Housing; Leisure Time; Intimacy; Physical Health; Resilience; Social Support; Talents/Skills; Transportation; Vocational/Educational   Sleep  Sleep:Poor  Number of hours:  No data recorded  No data recorded  Physical Exam: Physical  Exam Vitals and nursing note reviewed.  Constitutional:      Appearance:  Normal appearance. She is well-developed.  HENT:     Head: Normocephalic and atraumatic.     Nose: Nose normal.  Cardiovascular:     Rate and Rhythm: Normal rate.  Pulmonary:     Effort: Pulmonary effort is normal.  Musculoskeletal:        General: Normal range of motion.     Cervical Mcintyre: Normal range of motion.  Skin:    General: Skin is warm and dry.  Neurological:     Mental Status: She is alert and oriented to person, place, and time.  Psychiatric:        Attention and Perception: Attention and perception normal.        Mood and Affect: Affect normal. Mood is depressed.        Speech: Speech normal.        Behavior: Behavior normal. Behavior is cooperative.        Thought Content: Thought content normal.        Cognition and Memory: Cognition and memory normal.        Judgment: Judgment normal.   Review of Systems  Constitutional: Negative.   HENT: Negative.    Eyes: Negative.   Respiratory: Negative.    Cardiovascular: Negative.   Gastrointestinal: Negative.   Genitourinary: Negative.   Musculoskeletal: Negative.   Skin: Negative.   Neurological: Negative.   Endo/Heme/Allergies: Negative.   Psychiatric/Behavioral:  Positive for depression. The patient has insomnia.   Blood pressure 131/81, pulse 66, temperature 98.7 F (37.1 C), temperature source Oral, resp. rate 18, SpO2 100 %. There is no height or weight on file to calculate BMI.  Musculoskeletal: Strength & Muscle Tone: within normal limits Gait & Station: normal Patient leans: N/A   [facility] MSE Discharge Disposition for Follow up and Recommendations: Based on my evaluation the patient does not appear to have an emergency medical condition and can be discharged with resources and follow up care in outpatient services for Medication Management and Individual Therapy Patient reviewed with Dr. Bronwen Betters. Follow-up with outpatient  psychiatry, resources provided.   Lenard Lance, FNP 09/20/2020, 5:32 PM

## 2020-09-20 NOTE — BH Assessment (Signed)
Pt reports worsening depression and anxiety for the past couple months. Pt reports that she is able to function everywhere besides at home and it is causing some conflict in her marriage. Denies SI,HI, AVH and substance use.  PT is routine.

## 2020-09-20 NOTE — ED Notes (Signed)
Pt discharged home with AVS.  AVS reviewed prior to discharge.  Pt alert, oriented, and ambulatory upon discharge.

## 2020-12-01 ENCOUNTER — Encounter (HOSPITAL_COMMUNITY): Payer: Self-pay | Admitting: *Deleted
